# Patient Record
Sex: Female | Born: 1945 | Race: White | Hispanic: No | Marital: Married | Smoking: Never smoker
Health system: Southern US, Community
[De-identification: ages and names within clinical notes are randomized; demographics above are authoritative.]

## PROBLEM LIST (undated history)

## (undated) DIAGNOSIS — E782 Mixed hyperlipidemia: Secondary | ICD-10-CM

## (undated) DIAGNOSIS — R1084 Generalized abdominal pain: Secondary | ICD-10-CM

## (undated) DIAGNOSIS — I809 Phlebitis and thrombophlebitis of unspecified site: Secondary | ICD-10-CM

## (undated) DIAGNOSIS — I10 Essential (primary) hypertension: Secondary | ICD-10-CM

## (undated) DIAGNOSIS — E119 Type 2 diabetes mellitus without complications: Secondary | ICD-10-CM

## (undated) DIAGNOSIS — J3089 Other allergic rhinitis: Secondary | ICD-10-CM

## (undated) DIAGNOSIS — R5383 Other fatigue: Secondary | ICD-10-CM

## (undated) DIAGNOSIS — O99891 Other specified diseases and conditions complicating pregnancy: Secondary | ICD-10-CM

## (undated) DIAGNOSIS — O9989 Other specified diseases and conditions complicating pregnancy, childbirth and the puerperium: Secondary | ICD-10-CM

## (undated) DIAGNOSIS — Z96659 Presence of unspecified artificial knee joint: Secondary | ICD-10-CM

## (undated) DIAGNOSIS — R112 Nausea with vomiting, unspecified: Secondary | ICD-10-CM

## (undated) DIAGNOSIS — M150 Primary generalized (osteo)arthritis: Secondary | ICD-10-CM

## (undated) DIAGNOSIS — Z9289 Personal history of other medical treatment: Secondary | ICD-10-CM

## (undated) DIAGNOSIS — Z9889 Other specified postprocedural states: Secondary | ICD-10-CM

## (undated) DIAGNOSIS — N393 Stress incontinence (female) (male): Secondary | ICD-10-CM

## (undated) DIAGNOSIS — T8859XA Other complications of anesthesia, initial encounter: Secondary | ICD-10-CM

## (undated) DIAGNOSIS — K219 Gastro-esophageal reflux disease without esophagitis: Secondary | ICD-10-CM

## (undated) DIAGNOSIS — R1013 Epigastric pain: Secondary | ICD-10-CM

## (undated) DIAGNOSIS — T4145XA Adverse effect of unspecified anesthetic, initial encounter: Secondary | ICD-10-CM

## (undated) DIAGNOSIS — R531 Weakness: Secondary | ICD-10-CM

## (undated) DIAGNOSIS — K210 Gastro-esophageal reflux disease with esophagitis: Secondary | ICD-10-CM

## (undated) DIAGNOSIS — M1990 Unspecified osteoarthritis, unspecified site: Secondary | ICD-10-CM

## (undated) DIAGNOSIS — F419 Anxiety disorder, unspecified: Secondary | ICD-10-CM

## (undated) DIAGNOSIS — R5381 Other malaise: Secondary | ICD-10-CM

## (undated) HISTORY — DX: Epigastric pain: R10.13

## (undated) HISTORY — DX: Presence of unspecified artificial knee joint: Z96.659

## (undated) HISTORY — DX: Stress incontinence (female) (male): N39.3

## (undated) HISTORY — DX: Mixed hyperlipidemia: E78.2

## (undated) HISTORY — DX: Other allergic rhinitis: J30.89

## (undated) HISTORY — PX: HERNIA REPAIR: SHX51

## (undated) HISTORY — DX: Type 2 diabetes mellitus without complications: E11.9

## (undated) HISTORY — DX: Generalized abdominal pain: R10.84

## (undated) HISTORY — DX: Essential (primary) hypertension: I10

## (undated) HISTORY — PX: CATARACT EXTRACTION: SUR2

## (undated) HISTORY — DX: Weakness: R53.1

## (undated) HISTORY — PX: APPENDECTOMY: SHX54

## (undated) HISTORY — DX: Other malaise: R53.81

## (undated) HISTORY — PX: CHOLECYSTECTOMY: SHX55

## (undated) HISTORY — DX: Gastro-esophageal reflux disease with esophagitis: K21.0

## (undated) HISTORY — DX: Primary generalized (osteo)arthritis: M15.0

## (undated) HISTORY — DX: Other fatigue: R53.83

---

## 2001-03-31 HISTORY — PX: COLONOSCOPY: SHX174

## 2007-05-02 HISTORY — PX: JOINT REPLACEMENT: SHX530

## 2007-12-23 ENCOUNTER — Inpatient Hospital Stay (HOSPITAL_COMMUNITY): Admission: RE | Admit: 2007-12-23 | Discharge: 2007-12-25 | Payer: Self-pay | Admitting: Orthopedic Surgery

## 2010-09-13 NOTE — Op Note (Signed)
[name], Joanne Mitchell                ACCOUNT NO.:  [id number]   MEDICAL RECORD NO.:  [id number]          PATIENT TYPE:  INP   LOCATION:  5015                         FACILITY:  [facility]   PHYSICIAN:  Mila Homer. Sherlean Foot, M.D. DATE OF BIRTH:  1945-09-24   DATE OF PROCEDURE:  12/23/2007  DATE OF DISCHARGE:                               OPERATIVE REPORT   SURGEON:  Mila Homer. Sherlean Foot, MD   ASSISTANTS:  1. Skip Mayer, PA  2. Altamese Cabal, PA-C   ANESTHESIA:  General.   PREOPERATIVE DIAGNOSIS:  Left knee osteoarthritis.   POSTOPERATIVE DIAGNOSIS:  Left knee osteoarthritis.   PROCEDURE:  Left total knee arthroplasty.   INDICATIONS FOR PROCEDURE:  The patient is a 65 year old white female  with failure of conservative measures for osteoarthritis of the left  knee.  Informed consent was obtained.   DESCRIPTION OF PROCEDURE:  The patient was laid supine, administered  general anesthesia, and the Foley catheter was placed.  Left leg was  prepped and draped in the usual sterile fashion.  The extremity was  exsanguinated with the Esmarch and tourniquet inflated to 350 mmHg.  I  made a midline incision approximately 6 inches in length with a #10  blade.  I used a new blade to make a median parapatellar arthrotomy and  performed a synovectomy.  I elevated deep MCL off the medial crest of  the tibia.  I everted the patella, measured 22 mm thick.  I reamed down  9 mm and drilled 3 lug holes and with the prosthetic trial in place  recreated to 23-mm thickness.  I then removed the trial component and  went into flexion.  I used extramedullary alignment system on the tibia  and made a perpendicular cut to the anatomic axis.  I then used  intramedullary alignment system on the femur to make a 6-degree valgus  cut to distal varus knee.  I then marked out the epicondylar axis,  posterior condylar angle measured 3 degrees.  Sized to size D, pinned  through 3-degree external rotation holes.  I made the  anterior,  posterior, and chamfer cuts with a sagittal saw performed on a cutting  block.  I then removed the cutting block from the cut surfaces.  I then  placed a lamina spreader in the knee and removed the medial and lateral  menisci and posterior condylar osteophytes.  ACL and PCL were removed as  well.  I then placed a 12-mm spacer block in the knee.  I did have to do  a little bit more releasing medially.  I then put the scoop retractor in  place and finished the femur with a size D femoral block.  Finished the  tibia with a size 4 tibial tray drilling keel.  Then trialed with a 4  tibia, D femur, 12 insert, 29 patella, and had good flexion/extension  gap balance and good patellar tracking.  I removed the trial components.  I copiously irrigated.  I then cemented the components, allowed the  cement to harden in extension after removing excess cement.  I then  placed  a Hemovac deep to the arthrotomy coming out superolaterally.  I  brought the pain catheter from superomedially superficial to the  arthrotomy.  I then irrigated and closed with #1 Vicryl in the  arthrotomy, buried 0 Vicryl, subcuticular 2-0 Vicryl sutures, and skin  staples.   COMPLICATIONS:  None.   DRAINS:  One Hemovac and one pain catheter.   ESTIMATED BLOOD LOSS:  300 mL.           ______________________________  Mila Homer. Sherlean Foot, M.D.     SDL/MEDQ  D:  12/23/2007  T:  12/23/2007  Job:  [id number]

## 2010-09-16 NOTE — Discharge Summary (Signed)
Joanne Mitchell                ACCOUNT NO.:  [id number]   MEDICAL RECORD NO.:  [id number]          PATIENT TYPE:  INP   LOCATION:  5015                         FACILITY:  [facility]   PHYSICIAN:  Mila Homer. Sherlean Foot, M.D. DATE OF BIRTH:  [birth year]   DATE OF ADMISSION:  12/23/2007  DATE OF DISCHARGE:  12/25/2007                               DISCHARGE SUMMARY   ADMISSION DIAGNOSIS:  Osteoarthritis of the left knee, end-stage  arthritis.   DISCHARGE DIAGNOSES:  1. Osteoarthritis, left knee.  2. History of hypertension.  3. Obesity.  4. Diabetes type 2.  5. Esophageal reflux.  6. History of transient ischemic attack.  7. Acute blood loss anemia.   PROCEDURE:  Left total knee arthroscopy.   HISTORY:  Joanne Mitchell is a 65 year old female complaining of chronic left  knee pain with new-onset popping and catching for several months.  Pain  has limited her ADLs and she has failed conservative measures.  The  patient has very severe OA with loose body __________ x-ray and MRI  scan.  The risks and benefits of surgery were discussed with the  patient, and she would like to proceed with the procedure.   HOSPITAL PROCEDURE:  The patient is a 65 year old female who was  admitted on December 23, 2007, after appropriate laboratory studies were  obtained, as well as Ancef 2 g IV.  On call to the operating room, she  was taken to the operating room where she underwent a left total knee  arthroscopy.  She tolerated the procedure well, and she was placed on a  Dilaudid PCA upon reduced dose protocol and had a CPM and moved to 5000.  She was started the next day on Lovenox 30 mg subcutaneously q.12 h.  beginning December 24, 2007, at 8 a.m.  She also had consults to PT/OT.  Care managements were made in CPM.  She was weightbearing as tolerated.  CPM was 0 to 90 degrees for 6-8 hours per day.  Foley was placed  intraoperatively.  She was allowed out of bed to the chair on the  following day.  The patient  was up with physical therapy that evening.  On postoperative day 1, the patient was doing well.  Labs were stable.  She was weaned off her O2 and was out of bed with PT.  There were no  complications.  On next day on postoperative day 2, the patient  continued to work with PT.  Her PCA was pulled and her drains were  pulled out.  Her Foley was also discontinued.  On postoperative day 2,  she had marked improvement and was discharged home that day.   As for laboratory studies, she was admitted with hemoglobin of 15.5,  hematocrit of 35.5, and platelets of 199.  Her sodium was 136, potassium  4.5, chloride is 105, CO2 is 24, and glucose is 155.  Her BUN was 14 and  creatinine was 0.69.  Her white count was 9.5.  On discharge, her white  count was 11.2, her H&H was 12 and 35.5, and her platelets were 199.  Her sodium was 137, K was 3.3, chloride was 98, CO2 was 33, and glucose  was 162.  BUN was 3 and creatinine was 0.46.  There was no growth on  urine culture.   Her medications at the time of discharge were:  1. Zolpidem 10 mg as needed.  2. Hydrochlorothiazide 12.5 mg daily.  3. Nasonex spray 1 in each nostril as needed.  4. Amlodipine 5 mg daily.  5. Glimepiride 2 mg daily.  6. Multivitamin Silver 1 daily.  7. Prilosec 20 mg once daily.  8. She was placed on Robaxin 500 mg 1 tab every 6-8 hours as needed      for spasm.  9. Lovenox 40 mg inject once subcutaneously daily, last dose to be      01/06/2008.  10.Percocet 5/325, 1-2 tabs every 4-6 hours as needed for pain.  11.Potassium chloride 10 mEq 1 tab twice a day x1 week.   DISCHARGE INSTRUCTIONS:  The patient should follow her regular diabetic  diet.  She is to follow up with [facility] for wound care instructions  that she was given.  She is to increase activity slowly and she may use  a walker.  She is weightbearing as tolerated.  She will go home with  home health.  There is no lifting or driving x6 weeks.  She was given   prescriptions they were given above prescriptions for Robaxin, Percocet,  potassium chloride, and Lovenox.  She is to follow up with Dr. Sherlean Foot on  January 07, 2008, she is to call for that appointment at [phone].  The  patient was discharged in improved condition.     ______________________________  Altamese Cabal, PA-C    ______________________________  Mila Homer. Sherlean Foot, M.D.    MJ/MEDQ  D:  01/24/2008  T:  01/25/2008  Job:  [id number]

## 2014-02-26 DIAGNOSIS — M1711 Unilateral primary osteoarthritis, right knee: Secondary | ICD-10-CM

## 2014-02-26 HISTORY — DX: Unilateral primary osteoarthritis, right knee: M17.11

## 2014-07-17 ENCOUNTER — Other Ambulatory Visit: Payer: Self-pay | Admitting: Orthopedic Surgery

## 2014-07-30 NOTE — Pre-Procedure Instructions (Signed)
Bianney Tambi Thole  07/30/2014   Your procedure is scheduled on: Monday, April 11.  Report to [facility] Admitting at 5:30 AM.  Call this number if you have problems the morning of surgery: [phone]   Remember:   Do not eat food or drink liquids after midnight Sunday, April 10.   Take these medicines the morning of surgery with A SIP OF WATER: -   Do not wear jewelry, make-up or nail polish.  Do not wear lotions, powders, or perfumes.  Do not shave 48 hours prior to surgery.   Do not bring valuables to the hospital.              [facility] is not responsible for any belongings or valuables.               Contacts, dentures or bridgework may not be worn into surgery.  Leave suitcase in the car. After surgery it may be brought to your room.  For patients admitted to the hospital, discharge time is determined by your  treatment team.                Special Instructions: -    Please read over the following fact sheets that you were given: Pain Booklet, Coughing and Deep Breathing and Surgical Site Infection Prevention and Incentive Spirometry

## 2014-07-31 ENCOUNTER — Encounter (HOSPITAL_COMMUNITY)
Admission: RE | Admit: 2014-07-31 | Discharge: 2014-07-31 | Disposition: A | Discharge status: Home / Self Care | Payer: Medicare Other | Source: Ambulatory Visit | Attending: Orthopedic Surgery | Admitting: Orthopedic Surgery

## 2014-07-31 ENCOUNTER — Encounter (HOSPITAL_COMMUNITY): Payer: Self-pay

## 2014-07-31 DIAGNOSIS — Z01812 Encounter for preprocedural laboratory examination: Secondary | ICD-10-CM | POA: Insufficient documentation

## 2014-07-31 DIAGNOSIS — I517 Cardiomegaly: Secondary | ICD-10-CM | POA: Insufficient documentation

## 2014-07-31 DIAGNOSIS — Z01818 Encounter for other preprocedural examination: Secondary | ICD-10-CM | POA: Insufficient documentation

## 2014-07-31 HISTORY — DX: Essential (primary) hypertension: I10

## 2014-07-31 HISTORY — DX: Other specified postprocedural states: Z98.890

## 2014-07-31 HISTORY — DX: Anxiety disorder, unspecified: F41.9

## 2014-07-31 HISTORY — DX: Other complications of anesthesia, initial encounter: T88.59XA

## 2014-07-31 HISTORY — DX: Unspecified osteoarthritis, unspecified site: M19.90

## 2014-07-31 HISTORY — DX: Personal history of other medical treatment: Z92.89

## 2014-07-31 HISTORY — DX: Other specified diseases and conditions complicating pregnancy, childbirth and the puerperium: O99.89

## 2014-07-31 HISTORY — DX: Nausea with vomiting, unspecified: R11.2

## 2014-07-31 HISTORY — DX: Type 2 diabetes mellitus without complications: E11.9

## 2014-07-31 HISTORY — DX: Gastro-esophageal reflux disease without esophagitis: K21.9

## 2014-07-31 HISTORY — DX: Adverse effect of unspecified anesthetic, initial encounter: T41.45XA

## 2014-07-31 HISTORY — DX: Other specified diseases and conditions complicating pregnancy: O99.891

## 2014-07-31 HISTORY — DX: Phlebitis and thrombophlebitis of unspecified site: I80.9

## 2014-07-31 LAB — APTT: aPTT: 29 seconds (ref 24–37)

## 2014-07-31 LAB — URINALYSIS, ROUTINE W REFLEX MICROSCOPIC
Bilirubin Urine: NEGATIVE
Glucose, UA: NEGATIVE mg/dL
Ketones, ur: NEGATIVE mg/dL
Nitrite: NEGATIVE
PH: 6 (ref 5.0–8.0)
Protein, ur: NEGATIVE mg/dL
SPECIFIC GRAVITY, URINE: 1.019 (ref 1.005–1.030)
UROBILINOGEN UA: 0.2 mg/dL (ref 0.0–1.0)

## 2014-07-31 LAB — CBC WITH DIFFERENTIAL/PLATELET
BASOS ABS: 0.1 10*3/uL (ref 0.0–0.1)
Basophils Relative: 0 % (ref 0–1)
EOS PCT: 2 % (ref 0–5)
Eosinophils Absolute: 0.3 10*3/uL (ref 0.0–0.7)
HCT: 45.9 % (ref 36.0–46.0)
HEMOGLOBIN: 15.5 g/dL — AB (ref 12.0–15.0)
LYMPHS ABS: 4.2 10*3/uL — AB (ref 0.7–4.0)
LYMPHS PCT: 31 % (ref 12–46)
MCH: 30.9 pg (ref 26.0–34.0)
MCHC: 33.8 g/dL (ref 30.0–36.0)
MCV: 91.6 fL (ref 78.0–100.0)
Monocytes Absolute: 1.1 10*3/uL — ABNORMAL HIGH (ref 0.1–1.0)
Monocytes Relative: 8 % (ref 3–12)
NEUTROS ABS: 8.1 10*3/uL — AB (ref 1.7–7.7)
NEUTROS PCT: 59 % (ref 43–77)
PLATELETS: 297 10*3/uL (ref 150–400)
RBC: 5.01 MIL/uL (ref 3.87–5.11)
RDW: 14.5 % (ref 11.5–15.5)
WBC: 13.7 10*3/uL — AB (ref 4.0–10.5)

## 2014-07-31 LAB — URINE MICROSCOPIC-ADD ON

## 2014-07-31 LAB — COMPREHENSIVE METABOLIC PANEL
ALBUMIN: 4.1 g/dL (ref 3.5–5.2)
ALT: 18 U/L (ref 0–35)
AST: 16 U/L (ref 0–37)
Alkaline Phosphatase: 77 U/L (ref 39–117)
Anion gap: 10 (ref 5–15)
BUN: 18 mg/dL (ref 6–23)
CALCIUM: 9.7 mg/dL (ref 8.4–10.5)
CHLORIDE: 102 mmol/L (ref 96–112)
CO2: 27 mmol/L (ref 19–32)
CREATININE: 0.65 mg/dL (ref 0.50–1.10)
GFR calc Af Amer: >90 mL/min (ref >90)
GFR, EST NON AFRICAN AMERICAN: 89 mL/min — AB (ref >90)
Glucose, Bld: 108 mg/dL — ABNORMAL HIGH (ref 70–99)
Potassium: 4 mmol/L (ref 3.5–5.1)
SODIUM: 139 mmol/L (ref 135–145)
Total Bilirubin: 0.8 mg/dL (ref 0.3–1.2)
Total Protein: 7.5 g/dL (ref 6.0–8.3)

## 2014-07-31 LAB — PROTIME-INR
INR: 1.11 (ref 0.00–1.49)
Prothrombin Time: 14.5 seconds (ref 11.6–15.2)

## 2014-07-31 LAB — SURGICAL PCR SCREEN
MRSA, PCR: NEGATIVE
Staphylococcus aureus: POSITIVE — AB

## 2014-07-31 NOTE — Progress Notes (Signed)
Joanne Mitchell 07/30/2014  Your procedure is scheduled on: Monday, April 11. Report to [facility] Admitting at 5:30 AM. Call this number if you have problems the morning of surgery: [phone]  Remember:  Do not eat food or drink liquids after midnight Sunday, April 10. Take these medicines the morning of surgery with A SIP OF WATER: amlodipine, and use Omeprazole if needed    Do not wear jewelry, make-up or nail polish.  Do not wear lotions, powders, or perfumes.  Do not shave 48 hours prior to surgery.   Do not bring valuables to the hospital.    [facility] is not responsible for any belongings or valuables.   Contacts, dentures or bridgework may not be worn into surgery.  Leave suitcase in the car. After surgery it may be brought to your room.  For patients admitted to the hospital, discharge time is determined by your treatment team.    Special Instructions: Special Instructions: [facility] - Preparing for Surgery  Before surgery, you can play an important role.  Because skin is not sterile, your skin needs to be as free of germs as possible.  You can reduce the number of germs on you skin by washing with CHG (chlorahexidine gluconate) soap before surgery.  CHG is an antiseptic cleaner which kills germs and bonds with the skin to continue killing germs even after washing.  Please DO NOT use if you have an allergy to CHG or antibacterial soaps.  If your skin becomes reddened/irritated stop using the CHG and inform your nurse when you arrive at Short Stay.  Do not shave (including legs and underarms) for at least 48 hours prior to the first CHG shower.  You may shave your face.  Please follow these instructions  carefully:   1.  Shower with CHG Soap the night before surgery and the  morning of Surgery.  2.  If you choose to wash your hair, wash your hair first as usual with your  normal shampoo.  3.  After you shampoo, rinse your hair and body thoroughly to remove the  Shampoo.  4.  Use CHG as you would any other liquid soap.  You can apply chg directly to the skin and wash gently with scrungie or a clean washcloth.  5.  Apply the CHG Soap to your body ONLY FROM THE NECK DOWN.    Do not use on open wounds or open sores.  Avoid contact with your eyes, ears, mouth and genitals (private parts).  Wash genitals (private parts)   with your normal soap.  6.  Wash thoroughly, paying special attention to the area where your surgery will be performed.  7.  Thoroughly rinse your body with warm water from the neck down.  8.  DO NOT shower/wash with your normal soap after using and rinsing off   the CHG Soap.  9.  Pat yourself dry with a clean towel.            10.  Wear clean pajamas.            11 .  Place clean sheets on your bed the night of your first shower and do not sleep with pets.  Day of Surgery  Do not apply any lotions/deodorants the morning of surgery.  Please wear clean clothes to the hospital/surgery center.  Please read over the following fact sheets that you were given: Pain Booklet, Coughing and Deep Breathing and Surgical Site Infection Prevention and Incentive Spirometry

## 2014-07-31 NOTE — Progress Notes (Signed)
Requested last ov note, ekg from PCP office, Dr. Bea Graff.

## 2014-07-31 NOTE — Progress Notes (Signed)
Recently treated for poison ivy/oak, on prednisone & improving on face & L leg

## 2014-08-02 LAB — URINE CULTURE

## 2014-08-07 MED ORDER — TRANEXAMIC ACID 100 MG/ML IV SOLN
1000.0000 mg | INTRAVENOUS | Status: AC
  Administered 2014-08-10: 1000 mg via INTRAVENOUS
  Filled 2014-08-07: qty 10

## 2014-08-07 MED ORDER — BUPIVACAINE LIPOSOME 1.3 % IJ SUSP
20.0000 mL | Freq: Once | INTRAMUSCULAR | Status: AC
  Administered 2014-08-10: 20 mL
  Filled 2014-08-07: qty 20

## 2014-08-09 MED ORDER — CEFAZOLIN SODIUM-DEXTROSE 2-3 GM-% IV SOLR
2.0000 g | INTRAVENOUS | Status: AC
  Administered 2014-08-10: 2 g via INTRAVENOUS
  Filled 2014-08-09: qty 50

## 2014-08-10 ENCOUNTER — Inpatient Hospital Stay (HOSPITAL_COMMUNITY): Payer: Medicare Other | Admitting: Anesthesiology

## 2014-08-10 ENCOUNTER — Inpatient Hospital Stay (HOSPITAL_COMMUNITY)
Admission: RE | Admit: 2014-08-10 | Discharge: 2014-08-11 | DRG: 470 | Disposition: A | Discharge status: Home Health | Payer: Medicare Other | Source: Ambulatory Visit | Attending: Orthopedic Surgery | Admitting: Orthopedic Surgery

## 2014-08-10 ENCOUNTER — Encounter (HOSPITAL_COMMUNITY)
Admission: RE | Disposition: A | Discharge status: Home Health | Payer: Self-pay | Source: Ambulatory Visit | Attending: Orthopedic Surgery

## 2014-08-10 ENCOUNTER — Encounter (HOSPITAL_COMMUNITY): Payer: Self-pay | Admitting: *Deleted

## 2014-08-10 DIAGNOSIS — Z96659 Presence of unspecified artificial knee joint: Secondary | ICD-10-CM

## 2014-08-10 DIAGNOSIS — M1711 Unilateral primary osteoarthritis, right knee: Secondary | ICD-10-CM | POA: Diagnosis present

## 2014-08-10 DIAGNOSIS — Z9049 Acquired absence of other specified parts of digestive tract: Secondary | ICD-10-CM | POA: Diagnosis present

## 2014-08-10 DIAGNOSIS — Z881 Allergy status to other antibiotic agents status: Secondary | ICD-10-CM | POA: Diagnosis not present

## 2014-08-10 DIAGNOSIS — M25561 Pain in right knee: Secondary | ICD-10-CM | POA: Diagnosis present

## 2014-08-10 DIAGNOSIS — D62 Acute posthemorrhagic anemia: Secondary | ICD-10-CM | POA: Diagnosis not present

## 2014-08-10 DIAGNOSIS — E119 Type 2 diabetes mellitus without complications: Secondary | ICD-10-CM | POA: Diagnosis present

## 2014-08-10 DIAGNOSIS — Z8672 Personal history of thrombophlebitis: Secondary | ICD-10-CM | POA: Diagnosis not present

## 2014-08-10 DIAGNOSIS — K219 Gastro-esophageal reflux disease without esophagitis: Secondary | ICD-10-CM | POA: Diagnosis present

## 2014-08-10 DIAGNOSIS — I10 Essential (primary) hypertension: Secondary | ICD-10-CM | POA: Diagnosis present

## 2014-08-10 DIAGNOSIS — Z7952 Long term (current) use of systemic steroids: Secondary | ICD-10-CM

## 2014-08-10 HISTORY — PX: TOTAL KNEE ARTHROPLASTY: SHX125

## 2014-08-10 HISTORY — DX: Presence of unspecified artificial knee joint: Z96.659

## 2014-08-10 LAB — GLUCOSE, CAPILLARY
GLUCOSE-CAPILLARY: 222 mg/dL — AB (ref 70–99)
GLUCOSE-CAPILLARY: 243 mg/dL — AB (ref 70–99)
Glucose-Capillary: 140 mg/dL — ABNORMAL HIGH (ref 70–99)
Glucose-Capillary: 182 mg/dL — ABNORMAL HIGH (ref 70–99)
Glucose-Capillary: 242 mg/dL — ABNORMAL HIGH (ref 70–99)

## 2014-08-10 LAB — CBC
HEMATOCRIT: 42.8 % (ref 36.0–46.0)
HEMOGLOBIN: 13.8 g/dL (ref 12.0–15.0)
MCH: 30.5 pg (ref 26.0–34.0)
MCHC: 32.2 g/dL (ref 30.0–36.0)
MCV: 94.7 fL (ref 78.0–100.0)
Platelets: 262 10*3/uL (ref 150–400)
RBC: 4.52 MIL/uL (ref 3.87–5.11)
RDW: 14.8 % (ref 11.5–15.5)
WBC: 19 10*3/uL — ABNORMAL HIGH (ref 4.0–10.5)

## 2014-08-10 LAB — CREATININE, SERUM
CREATININE: 0.73 mg/dL (ref 0.50–1.10)
GFR calc non Af Amer: 86 mL/min — ABNORMAL LOW (ref >90)

## 2014-08-10 SURGERY — ARTHROPLASTY, KNEE, TOTAL
Anesthesia: General | Site: Knee | Laterality: Right

## 2014-08-10 MED ORDER — DIPHENHYDRAMINE HCL 50 MG/ML IJ SOLN
INTRAMUSCULAR | Status: DC | PRN
  Administered 2014-08-10: 12.5 mg via INTRAVENOUS

## 2014-08-10 MED ORDER — LORAZEPAM 0.5 MG PO TABS: 0.5000 mg | ORAL_TABLET | Freq: Two times a day (BID) | ORAL | Status: DC | PRN

## 2014-08-10 MED ORDER — LIDOCAINE HCL (CARDIAC) 20 MG/ML IV SOLN
INTRAVENOUS | Status: DC | PRN
  Administered 2014-08-10: 100 mg via INTRAVENOUS

## 2014-08-10 MED ORDER — PHENYLEPHRINE 40 MCG/ML (10ML) SYRINGE FOR IV PUSH (FOR BLOOD PRESSURE SUPPORT)
PREFILLED_SYRINGE | INTRAVENOUS | Status: AC
  Filled 2014-08-10: qty 10

## 2014-08-10 MED ORDER — METHOCARBAMOL 1000 MG/10ML IJ SOLN
500.0000 mg | INTRAVENOUS | Status: AC
  Administered 2014-08-10: 500 mg via INTRAVENOUS
  Filled 2014-08-10: qty 5

## 2014-08-10 MED ORDER — LIDOCAINE HCL (CARDIAC) 20 MG/ML IV SOLN
INTRAVENOUS | Status: AC
  Filled 2014-08-10: qty 5

## 2014-08-10 MED ORDER — OXYCODONE HCL 5 MG PO TABS
5.0000 mg | ORAL_TABLET | ORAL | Status: DC | PRN
  Administered 2014-08-10: 5 mg via ORAL
  Administered 2014-08-10 – 2014-08-11 (×6): 10 mg via ORAL
  Filled 2014-08-10: qty 2
  Filled 2014-08-10: qty 1
  Filled 2014-08-10 (×5): qty 2

## 2014-08-10 MED ORDER — LACTATED RINGERS IV SOLN
INTRAVENOUS | Status: DC | PRN
  Administered 2014-08-10 (×2): via INTRAVENOUS

## 2014-08-10 MED ORDER — METOCLOPRAMIDE HCL 5 MG/ML IJ SOLN
INTRAMUSCULAR | Status: DC | PRN
  Administered 2014-08-10: 10 mg via INTRAVENOUS

## 2014-08-10 MED ORDER — METHOCARBAMOL 1000 MG/10ML IJ SOLN
500.0000 mg | Freq: Four times a day (QID) | INTRAVENOUS | Status: DC | PRN
  Filled 2014-08-10: qty 5

## 2014-08-10 MED ORDER — FENTANYL CITRATE 0.05 MG/ML IJ SOLN
INTRAMUSCULAR | Status: DC | PRN
  Administered 2014-08-10 (×2): 100 ug via INTRAVENOUS
  Administered 2014-08-10: 50 ug via INTRAVENOUS

## 2014-08-10 MED ORDER — ENOXAPARIN SODIUM 30 MG/0.3ML ~~LOC~~ SOLN
30.0000 mg | Freq: Two times a day (BID) | SUBCUTANEOUS | Status: DC
Start: 2014-08-11 — End: 2014-08-11
  Administered 2014-08-11: 30 mg via SUBCUTANEOUS
  Filled 2014-08-10: qty 0.3

## 2014-08-10 MED ORDER — FLUTICASONE PROPIONATE 50 MCG/ACT NA SUSP
1.0000 | Freq: Every day | NASAL | Status: DC | PRN
Start: 2014-08-10 — End: 2014-08-11
  Filled 2014-08-10: qty 16

## 2014-08-10 MED ORDER — ONDANSETRON HCL 4 MG PO TABS: 4.0000 mg | ORAL_TABLET | Freq: Four times a day (QID) | ORAL | Status: DC | PRN

## 2014-08-10 MED ORDER — SENNOSIDES-DOCUSATE SODIUM 8.6-50 MG PO TABS: 1.0000 | ORAL_TABLET | Freq: Every evening | ORAL | Status: DC | PRN

## 2014-08-10 MED ORDER — BUPIVACAINE-EPINEPHRINE (PF) 0.5% -1:200000 IJ SOLN
30.0000 mL | INTRAMUSCULAR | Status: DC
  Filled 2014-08-10: qty 30

## 2014-08-10 MED ORDER — MIDAZOLAM HCL 5 MG/5ML IJ SOLN
INTRAMUSCULAR | Status: DC | PRN
  Administered 2014-08-10: 2 mg via INTRAVENOUS

## 2014-08-10 MED ORDER — METOCLOPRAMIDE HCL 5 MG/ML IJ SOLN
5.0000 mg | Freq: Three times a day (TID) | INTRAMUSCULAR | Status: DC | PRN
  Administered 2014-08-10: 10 mg via INTRAVENOUS
  Filled 2014-08-10: qty 2

## 2014-08-10 MED ORDER — AMLODIPINE BESYLATE 5 MG PO TABS
5.0000 mg | ORAL_TABLET | Freq: Every day | ORAL | Status: DC
  Administered 2014-08-11: 5 mg via ORAL
  Filled 2014-08-10: qty 1

## 2014-08-10 MED ORDER — CELECOXIB 200 MG PO CAPS
200.0000 mg | ORAL_CAPSULE | Freq: Two times a day (BID) | ORAL | Status: DC
Start: 2014-08-10 — End: 2014-08-11
  Administered 2014-08-10 – 2014-08-11 (×2): 200 mg via ORAL
  Filled 2014-08-10 (×2): qty 1

## 2014-08-10 MED ORDER — HYDROMORPHONE HCL 1 MG/ML IJ SOLN
1.0000 mg | INTRAMUSCULAR | Status: DC | PRN
  Administered 2014-08-10 (×3): 1 mg via INTRAVENOUS
  Filled 2014-08-10 (×3): qty 1

## 2014-08-10 MED ORDER — PHENOL 1.4 % MT LIQD: 1.0000 | OROMUCOSAL | Status: DC | PRN

## 2014-08-10 MED ORDER — PANTOPRAZOLE SODIUM 40 MG PO TBEC
40.0000 mg | DELAYED_RELEASE_TABLET | Freq: Every day | ORAL | Status: DC
  Administered 2014-08-11: 40 mg via ORAL
  Filled 2014-08-10: qty 1

## 2014-08-10 MED ORDER — HYDROMORPHONE HCL 1 MG/ML IJ SOLN
0.2500 mg | INTRAMUSCULAR | Status: DC | PRN
  Administered 2014-08-10 (×2): 0.25 mg via INTRAVENOUS
  Administered 2014-08-10 (×2): 0.5 mg via INTRAVENOUS

## 2014-08-10 MED ORDER — SODIUM CHLORIDE 0.9 % IJ SOLN
INTRAMUSCULAR | Status: DC | PRN
  Administered 2014-08-10: 30 mL via INTRAVENOUS

## 2014-08-10 MED ORDER — SCOPOLAMINE 1 MG/3DAYS TD PT72
MEDICATED_PATCH | TRANSDERMAL | Status: AC
  Filled 2014-08-10: qty 1

## 2014-08-10 MED ORDER — 0.9 % SODIUM CHLORIDE (POUR BTL) OPTIME
TOPICAL | Status: DC | PRN
  Administered 2014-08-10: 1000 mL

## 2014-08-10 MED ORDER — BISACODYL 5 MG PO TBEC: 5.0000 mg | DELAYED_RELEASE_TABLET | Freq: Every day | ORAL | Status: DC | PRN

## 2014-08-10 MED ORDER — DIPHENHYDRAMINE HCL 50 MG/ML IJ SOLN
INTRAMUSCULAR | Status: AC
  Filled 2014-08-10: qty 1

## 2014-08-10 MED ORDER — HYDROCHLOROTHIAZIDE 12.5 MG PO CAPS
12.5000 mg | ORAL_CAPSULE | Freq: Every day | ORAL | Status: DC
  Administered 2014-08-10 – 2014-08-11 (×2): 12.5 mg via ORAL
  Filled 2014-08-10 (×2): qty 1

## 2014-08-10 MED ORDER — GLIMEPIRIDE 4 MG PO TABS
2.0000 mg | ORAL_TABLET | Freq: Every day | ORAL | Status: DC
  Administered 2014-08-11: 2 mg via ORAL
  Filled 2014-08-10: qty 1

## 2014-08-10 MED ORDER — ROCURONIUM BROMIDE 100 MG/10ML IV SOLN
INTRAVENOUS | Status: DC | PRN
  Administered 2014-08-10: 30 mg via INTRAVENOUS

## 2014-08-10 MED ORDER — SODIUM CHLORIDE 0.9 % IV SOLN
INTRAVENOUS | Status: DC
  Administered 2014-08-10: 11:00:00 via INTRAVENOUS

## 2014-08-10 MED ORDER — OXYCODONE HCL ER 10 MG PO T12A
10.0000 mg | EXTENDED_RELEASE_TABLET | Freq: Two times a day (BID) | ORAL | Status: DC
  Administered 2014-08-10 – 2014-08-11 (×2): 10 mg via ORAL
  Filled 2014-08-10 (×2): qty 1

## 2014-08-10 MED ORDER — MENTHOL 3 MG MT LOZG: 1.0000 | LOZENGE | OROMUCOSAL | Status: DC | PRN

## 2014-08-10 MED ORDER — MIDAZOLAM HCL 2 MG/2ML IJ SOLN
INTRAMUSCULAR | Status: AC
  Filled 2014-08-10: qty 2

## 2014-08-10 MED ORDER — FLEET ENEMA 7-19 GM/118ML RE ENEM: 1.0000 | ENEMA | Freq: Once | RECTAL | Status: AC | PRN

## 2014-08-10 MED ORDER — NEOSTIGMINE METHYLSULFATE 10 MG/10ML IV SOLN
INTRAVENOUS | Status: DC | PRN
  Administered 2014-08-10: 3 mg via INTRAVENOUS

## 2014-08-10 MED ORDER — TRANEXAMIC ACID 100 MG/ML IV SOLN
1000.0000 mg | Freq: Once | INTRAVENOUS | Status: DC
  Filled 2014-08-10: qty 10

## 2014-08-10 MED ORDER — ROCURONIUM BROMIDE 50 MG/5ML IV SOLN
INTRAVENOUS | Status: AC
  Filled 2014-08-10: qty 1

## 2014-08-10 MED ORDER — CHLORHEXIDINE GLUCONATE 4 % EX LIQD
60.0000 mL | Freq: Once | CUTANEOUS | Status: DC
  Filled 2014-08-10: qty 60

## 2014-08-10 MED ORDER — METOCLOPRAMIDE HCL 5 MG/ML IJ SOLN
INTRAMUSCULAR | Status: AC
  Filled 2014-08-10: qty 2

## 2014-08-10 MED ORDER — HYDROMORPHONE HCL 1 MG/ML IJ SOLN
INTRAMUSCULAR | Status: AC
Start: 2014-08-10 — End: 2014-08-10
  Filled 2014-08-10: qty 1

## 2014-08-10 MED ORDER — DEXAMETHASONE SODIUM PHOSPHATE 4 MG/ML IJ SOLN
INTRAMUSCULAR | Status: DC | PRN
  Administered 2014-08-10: 4 mg via INTRAVENOUS

## 2014-08-10 MED ORDER — ZOLPIDEM TARTRATE 5 MG PO TABS: 5.0000 mg | ORAL_TABLET | Freq: Every evening | ORAL | Status: DC | PRN

## 2014-08-10 MED ORDER — SCOPOLAMINE 1 MG/3DAYS TD PT72
MEDICATED_PATCH | TRANSDERMAL | Status: DC | PRN
  Administered 2014-08-10: 1 via TRANSDERMAL

## 2014-08-10 MED ORDER — METOCLOPRAMIDE HCL 5 MG PO TABS: 5.0000 mg | ORAL_TABLET | Freq: Three times a day (TID) | ORAL | Status: DC | PRN

## 2014-08-10 MED ORDER — CHLORHEXIDINE GLUCONATE 4 % EX LIQD
60.0000 mL | Freq: Once | CUTANEOUS | Status: DC
Start: 2014-08-10 — End: 2014-08-10
  Filled 2014-08-10: qty 60

## 2014-08-10 MED ORDER — ACETAMINOPHEN 325 MG PO TABS: 650.0000 mg | ORAL_TABLET | Freq: Four times a day (QID) | ORAL | Status: DC | PRN

## 2014-08-10 MED ORDER — DIPHENHYDRAMINE HCL 12.5 MG/5ML PO ELIX: 12.5000 mg | ORAL_SOLUTION | ORAL | Status: DC | PRN

## 2014-08-10 MED ORDER — ALUM & MAG HYDROXIDE-SIMETH 200-200-20 MG/5ML PO SUSP: 30.0000 mL | ORAL | Status: DC | PRN

## 2014-08-10 MED ORDER — SUCCINYLCHOLINE CHLORIDE 20 MG/ML IJ SOLN
INTRAMUSCULAR | Status: AC
  Filled 2014-08-10: qty 1

## 2014-08-10 MED ORDER — CEFAZOLIN SODIUM-DEXTROSE 2-3 GM-% IV SOLR
2.0000 g | Freq: Four times a day (QID) | INTRAVENOUS | Status: AC
  Administered 2014-08-10: 2 g via INTRAVENOUS
  Filled 2014-08-10 (×2): qty 50

## 2014-08-10 MED ORDER — PROPOFOL 10 MG/ML IV BOLUS
INTRAVENOUS | Status: DC | PRN
  Administered 2014-08-10: 200 mg via INTRAVENOUS

## 2014-08-10 MED ORDER — SODIUM CHLORIDE 0.9 % IV SOLN: INTRAVENOUS | Status: DC

## 2014-08-10 MED ORDER — INSULIN ASPART 100 UNIT/ML ~~LOC~~ SOLN
0.0000 [IU] | Freq: Three times a day (TID) | SUBCUTANEOUS | Status: DC
  Administered 2014-08-10 (×2): 5 [IU] via SUBCUTANEOUS
  Administered 2014-08-11: 3 [IU] via SUBCUTANEOUS

## 2014-08-10 MED ORDER — PROPOFOL 10 MG/ML IV BOLUS
INTRAVENOUS | Status: AC
  Filled 2014-08-10: qty 20

## 2014-08-10 MED ORDER — METHOCARBAMOL 500 MG PO TABS
500.0000 mg | ORAL_TABLET | Freq: Four times a day (QID) | ORAL | Status: DC | PRN
  Administered 2014-08-10 – 2014-08-11 (×2): 500 mg via ORAL
  Filled 2014-08-10 (×2): qty 1

## 2014-08-10 MED ORDER — DOCUSATE SODIUM 100 MG PO CAPS
100.0000 mg | ORAL_CAPSULE | Freq: Two times a day (BID) | ORAL | Status: DC
  Administered 2014-08-10 – 2014-08-11 (×2): 100 mg via ORAL
  Filled 2014-08-10 (×2): qty 1

## 2014-08-10 MED ORDER — SODIUM CHLORIDE 0.9 % IR SOLN
Status: DC | PRN
  Administered 2014-08-10: 1000 mL

## 2014-08-10 MED ORDER — ONDANSETRON HCL 4 MG/2ML IJ SOLN: 4.0000 mg | Freq: Once | INTRAMUSCULAR | Status: DC | PRN

## 2014-08-10 MED ORDER — GLYCOPYRROLATE 0.2 MG/ML IJ SOLN
INTRAMUSCULAR | Status: DC | PRN
  Administered 2014-08-10: 0.4 mg via INTRAVENOUS

## 2014-08-10 MED ORDER — PHENYLEPHRINE HCL 10 MG/ML IJ SOLN
INTRAMUSCULAR | Status: DC | PRN
  Administered 2014-08-10 (×3): 80 ug via INTRAVENOUS

## 2014-08-10 MED ORDER — ONDANSETRON HCL 4 MG/2ML IJ SOLN
INTRAMUSCULAR | Status: DC | PRN
  Administered 2014-08-10: 4 mg via INTRAVENOUS

## 2014-08-10 MED ORDER — FENTANYL CITRATE 0.05 MG/ML IJ SOLN
INTRAMUSCULAR | Status: AC
  Filled 2014-08-10: qty 5

## 2014-08-10 MED ORDER — OXYCODONE HCL 5 MG PO TABS
ORAL_TABLET | ORAL | Status: AC
  Filled 2014-08-10: qty 2

## 2014-08-10 MED ORDER — SUCCINYLCHOLINE CHLORIDE 20 MG/ML IJ SOLN
INTRAMUSCULAR | Status: DC | PRN
  Administered 2014-08-10: 120 mg via INTRAVENOUS

## 2014-08-10 MED ORDER — ONDANSETRON HCL 4 MG/2ML IJ SOLN
4.0000 mg | Freq: Four times a day (QID) | INTRAMUSCULAR | Status: DC | PRN
Start: 2014-08-10 — End: 2014-08-11

## 2014-08-10 MED ORDER — ACETAMINOPHEN 650 MG RE SUPP: 650.0000 mg | Freq: Four times a day (QID) | RECTAL | Status: DC | PRN

## 2014-08-10 MED ORDER — ONDANSETRON HCL 4 MG/2ML IJ SOLN
INTRAMUSCULAR | Status: AC
  Filled 2014-08-10: qty 2

## 2014-08-10 MED ORDER — DEXAMETHASONE SODIUM PHOSPHATE 4 MG/ML IJ SOLN
INTRAMUSCULAR | Status: AC
  Filled 2014-08-10: qty 1

## 2014-08-10 SURGICAL SUPPLY — 60 items
BANDAGE ELASTIC 6 VELCRO ST LF (GAUZE/BANDAGES/DRESSINGS) ×3 IMPLANT
BANDAGE ESMARK 6X9 LF (GAUZE/BANDAGES/DRESSINGS) ×1 IMPLANT
BLADE SAGITTAL 13X1.27X60 (BLADE) ×2 IMPLANT
BLADE SAGITTAL 13X1.27X60MM (BLADE) ×1
BLADE SAW SGTL 83.5X18.5 (BLADE) ×3 IMPLANT
BLADE SURG 10 STRL SS (BLADE) ×3 IMPLANT
BNDG ESMARK 6X9 LF (GAUZE/BANDAGES/DRESSINGS) ×3
BOWL SMART MIX CTS (DISPOSABLE) ×3 IMPLANT
CAPT KNEE TOTAL 3 ×3 IMPLANT
CEMENT BONE SIMPLEX SPEEDSET (Cement) ×6 IMPLANT
COVER SURGICAL LIGHT HANDLE (MISCELLANEOUS) ×3 IMPLANT
CUFF TOURNIQUET SINGLE 34IN LL (TOURNIQUET CUFF) ×3 IMPLANT
DRAIN CHANNEL 10F 3/8 F FF (DRAIN) ×3 IMPLANT
DRAPE EXTREMITY T 121X128X90 (DRAPE) ×3 IMPLANT
DRAPE IMP U-DRAPE 54X76 (DRAPES) ×3 IMPLANT
DRAPE INCISE IOBAN 66X45 STRL (DRAPES) ×6 IMPLANT
DRAPE PROXIMA HALF (DRAPES) IMPLANT
DRAPE U-SHAPE 47X51 STRL (DRAPES) ×3 IMPLANT
DRSG ADAPTIC 3X8 NADH LF (GAUZE/BANDAGES/DRESSINGS) ×3 IMPLANT
DRSG PAD ABDOMINAL 8X10 ST (GAUZE/BANDAGES/DRESSINGS) ×3 IMPLANT
DURAPREP 26ML APPLICATOR (WOUND CARE) ×6 IMPLANT
ELECT REM PT RETURN 9FT ADLT (ELECTROSURGICAL) ×3
ELECTRODE REM PT RTRN 9FT ADLT (ELECTROSURGICAL) ×1 IMPLANT
GAUZE SPONGE 4X4 12PLY STRL (GAUZE/BANDAGES/DRESSINGS) ×3 IMPLANT
GLOVE BIOGEL M 7.0 STRL (GLOVE) IMPLANT
GLOVE BIOGEL PI IND STRL 7.5 (GLOVE) ×1 IMPLANT
GLOVE BIOGEL PI IND STRL 8.5 (GLOVE) ×2 IMPLANT
GLOVE BIOGEL PI INDICATOR 7.5 (GLOVE) ×2
GLOVE BIOGEL PI INDICATOR 8.5 (GLOVE) ×4
GLOVE SURG ORTHO 8.0 STRL STRW (GLOVE) ×6 IMPLANT
GOWN STRL REUS W/ TWL LRG LVL3 (GOWN DISPOSABLE) ×1 IMPLANT
GOWN STRL REUS W/ TWL XL LVL3 (GOWN DISPOSABLE) ×2 IMPLANT
GOWN STRL REUS W/TWL LRG LVL3 (GOWN DISPOSABLE) ×2
GOWN STRL REUS W/TWL XL LVL3 (GOWN DISPOSABLE) ×4
HANDPIECE INTERPULSE COAX TIP (DISPOSABLE) ×2
HOOD PEEL AWAY FACE SHEILD DIS (HOOD) ×9 IMPLANT
KIT BASIN OR (CUSTOM PROCEDURE TRAY) ×3 IMPLANT
KIT ROOM TURNOVER OR (KITS) ×3 IMPLANT
KNEE CAPITATED TOTAL 3 ×1 IMPLANT
MANIFOLD NEPTUNE II (INSTRUMENTS) ×3 IMPLANT
NEEDLE 22X1 1/2 (OR ONLY) (NEEDLE) ×6 IMPLANT
NS IRRIG 1000ML POUR BTL (IV SOLUTION) ×3 IMPLANT
PACK TOTAL JOINT (CUSTOM PROCEDURE TRAY) ×3 IMPLANT
PACK UNIVERSAL I (CUSTOM PROCEDURE TRAY) ×3 IMPLANT
PAD ARMBOARD 7.5X6 YLW CONV (MISCELLANEOUS) ×6 IMPLANT
PADDING CAST COTTON 6X4 STRL (CAST SUPPLIES) ×3 IMPLANT
SET HNDPC FAN SPRY TIP SCT (DISPOSABLE) ×1 IMPLANT
SPONGE GAUZE 4X4 12PLY STER LF (GAUZE/BANDAGES/DRESSINGS) ×3 IMPLANT
STAPLER VISISTAT 35W (STAPLE) ×3 IMPLANT
SUCTION FRAZIER TIP 10 FR DISP (SUCTIONS) ×3 IMPLANT
SUT BONE WAX W31G (SUTURE) ×3 IMPLANT
SUT VIC AB 0 CTB1 27 (SUTURE) ×6 IMPLANT
SUT VIC AB 1 CT1 27 (SUTURE) ×6
SUT VIC AB 1 CT1 27XBRD ANBCTR (SUTURE) ×3 IMPLANT
SUT VIC AB 2-0 CT1 27 (SUTURE) ×4
SUT VIC AB 2-0 CT1 TAPERPNT 27 (SUTURE) ×2 IMPLANT
SYR 20CC LL (SYRINGE) ×6 IMPLANT
TOWEL OR 17X24 6PK STRL BLUE (TOWEL DISPOSABLE) ×3 IMPLANT
TOWEL OR 17X26 10 PK STRL BLUE (TOWEL DISPOSABLE) ×3 IMPLANT
WATER STERILE IRR 1000ML POUR (IV SOLUTION) ×6 IMPLANT

## 2014-08-10 NOTE — Op Note (Signed)
TOTAL KNEE REPLACEMENT OPERATIVE NOTE:  08/10/2014  1:33 PM  PATIENT:  Joanne Mitchell  69 y.o. female  PRE-OPERATIVE DIAGNOSIS:  primary osteoarthritis right knee  POST-OPERATIVE DIAGNOSIS:  primary osteoarthritis right knee  PROCEDURE:  Procedure(s): TOTAL KNEE ARTHROPLASTY  SURGEON:  Surgeon(s): Vickey Huger, MD  PHYSICIAN ASSISTANT: Carlynn Spry, [facility]  ANESTHESIA:   general  DRAINS: Hemovac  SPECIMEN: None  COUNTS:  Correct  TOURNIQUET:   Total Tourniquet Time Documented: Thigh (Right) - 64 minutes Total: Thigh (Right) - 64 minutes   DICTATION:  Indication for procedure:    The patient is a 69 y.o. female who has failed conservative treatment for primary osteoarthritis right knee.  Informed consent was obtained prior to anesthesia. The risks versus benefits of the operation were explain and in a way the patient can, and did, understand.   On the implant demand matching protocol, this patient scored 8.  Therefore, this patient did" "did not receive a polyethylene insert with vitamin E which is a high demand implant.  Description of procedure:     The patient was taken to the operating room and placed under anesthesia.  The patient was positioned in the usual fashion taking care that all body parts were adequately padded and/or protected.  I foley catheter was not placed.  A tourniquet was applied and the leg prepped and draped in the usual sterile fashion.  The extremity was exsanguinated with the esmarch and tourniquet inflated to 350 mmHg.  Pre-operative range of motion was 5-90 degrees flexion.  The knee was in 8 degree of significant varus.  A midline incision approximately 6-7 inches long was made with a #10 blade.  A new blade was used to make a parapatellar arthrotomy going 2-3 cm into the quadriceps tendon, over the patella, and alongside the medial aspect of the patellar tendon.  A synovectomy was then performed with the #10 blade and forceps. I then elevated  the deep MCL off the medial tibial metaphysis subperiosteally around to the semimembranosus attachment.    I everted the patella and used calipers to measure patellar thickness.  I used the reamer to ream down to appropriate thickness to recreate the native thickness.  I then removed excess bone with the rongeur and sagittal saw.  I used the appropriately sized template and drilled the three lug holes.  I then put the trial in place and measured the thickness with the calipers to ensure recreation of the native thickness.  The trial was then removed and the patella subluxed and the knee brought into flexion.  A homan retractor was place to retract and protect the patella and lateral structures.  A Z-retractor was place medially to protect the medial structures.  The extra-medullary alignment system was used to make cut the tibial articular surface perpendicular to the anamotic axis of the tibia and in 3 degrees of posterior slope.  The cut surface and alignment jig was removed.  I then used the intramedullary alignment guide to make a 6 valgus cut on the distal femur.  I then marked out the epicondylar axis on the distal femur.  The posterior condylar axis measured 3 degrees.  I then used the anterior referencing sizer and measured the femur to be a size 8.  The 4-In-1 cutting block was screwed into place in external rotation matching the posterior condylar angle, making our cuts perpendicular to the epicondylar axis.  Anterior, posterior and chamfer cuts were made with the sagittal saw.  The cutting block and  cut pieces were removed.  A lamina spreader was placed in 90 degrees of flexion.  The ACL, PCL, menisci, and posterior condylar osteophytes were removed.  A 16 mm spacer blocked was found to offer good flexion and extension gap balance after moderate in degree releasing.   The scoop retractor was then placed and the femoral finishing block was pinned in place.  The small sagittal saw was used as well  as the lug drill to finish the femur.  The block and cut surfaces were removed and the medullary canal hole filled with autograft bone from the cut pieces.  The tibia was delivered forward in deep flexion and external rotation.  A size E tray was selected and pinned into place centered on the medial 1/3 of the tibial tubercle.  The reamer and keel was used to prepare the tibia through the tray.    I then trialed with the size 8 femur, size E tibia, a 16 mm insert and the 32 patella.  I had excellent flexion/extension gap balance, excellent patella tracking.  Flexion was full and beyond 120 degrees; extension was zero.  These components were chosen and the staff opened them to me on the back table while the knee was lavaged copiously and the cement mixed.  The soft tissue was infiltrated with 60cc of exparel 1.3% through a 21 gauge needle.  I cemented in the components and removed all excess cement.  The polyethylene tibial component was snapped into place and the knee placed in extension while cement was hardening.  The capsule was infilltrated with 30cc of .25% Marcaine with epinephrine.  A hemovac was place in the joint exiting superolaterally.  A pain pump was place superomedially superficial to the arthrotomy.  Once the cement was hard, the tourniquet was let down.  Hemostasis was obtained.  The arthrotomy was closed with figure-8 #1 vicryl sutures.  The deep soft tissues were closed with #0 vicryls and the subcuticular layer closed with a running #2-0 vicryl.  The skin was reapproximated and closed with skin staples.  The wound was dressed with xeroform, 4 x4's, 2 ABD sponges, a single layer of webril and a TED stocking.   The patient was then awakened, extubated, and taken to the recovery room in stable condition.  BLOOD LOSS:  300cc DRAINS: 1 hemovac, 1 pain catheter COMPLICATIONS:  None.  PLAN OF CARE: Admit to inpatient   PATIENT DISPOSITION:  PACU - hemodynamically stable.   Delay start  of Pharmacological VTE agent (>24hrs) due to surgical blood loss or risk of bleeding:  not applicable  Please fax a copy of this op note to my office at [phone] (please only include page 1 and 2 of the Case Information op note)

## 2014-08-10 NOTE — Progress Notes (Signed)
Orthopedic Tech Progress Note Patient Details:  Joanne Mitchell 27-Mar-1946 [id number] On cpm at 7:20 pm Patient ID: Joanne Mitchell, female   DOB: 1945/11/09, 69 y.o.   MRN: [id number]   Braulio Bosch 08/10/2014, 7:20 PM

## 2014-08-10 NOTE — Progress Notes (Signed)
Orthopedic Tech Progress Note Patient Details:  Joanne Mitchell 08-28-1945 [id number]  CPM Right Knee CPM Right Knee: On Right Knee Flexion (Degrees): 90 Right Knee Extension (Degrees): 0 Additional Comments: trapeze bar patient helper Viewed order from doctor's order list  Hildred Priest 08/10/2014, 10:23 AM

## 2014-08-10 NOTE — Anesthesia Postprocedure Evaluation (Signed)
  Anesthesia Post-op Note  Patient: Joanne Mitchell  Procedure(s) Performed: Procedure(s): TOTAL KNEE ARTHROPLASTY (Right)  Patient Location: PACU  Anesthesia Type: General   Level of Consciousness: awake, alert  and oriented  Airway and Oxygen Therapy: Patient Spontanous Breathing  Post-op Pain: mild  Post-op Assessment: Post-op Vital signs reviewed  Post-op Vital Signs: Reviewed  Last Vitals:  Filed Vitals:   08/10/14 1042  BP:   Pulse:   Temp: 36.7 C  Resp:     Complications: No apparent anesthesia complications

## 2014-08-10 NOTE — Anesthesia Preprocedure Evaluation (Addendum)
Anesthesia Evaluation  Patient identified by MRN, date of birth, ID band Patient awake    Reviewed: Allergy & Precautions, NPO status , Patient's Chart, lab work & pertinent test results  History of Anesthesia Complications (+) PONV  Airway Mallampati: I       Dental   Pulmonary  breath sounds clear to auscultation  Pulmonary exam normal       Cardiovascular hypertension, Rhythm:Regular Rate:Normal     Neuro/Psych    GI/Hepatic GERD-  ,  Endo/Other  diabetes, Type 2, Oral Hypoglycemic Agents  Renal/GU      Musculoskeletal  (+) Arthritis -,   Abdominal   Peds  Hematology   Anesthesia Other Findings   Reproductive/Obstetrics                            Anesthesia Physical Anesthesia Plan  ASA: II  Anesthesia Plan: General   Post-op Pain Management:    Induction:   Airway Management Planned: LMA and Oral ETT  Additional Equipment:   Intra-op Plan:   Post-operative Plan: Extubation in OR  Informed Consent: I have reviewed the patients History and Physical, chart, labs and discussed the procedure including the risks, benefits and alternatives for the proposed anesthesia with the patient or authorized representative who has indicated his/her understanding and acceptance.     Plan Discussed with: CRNA, Anesthesiologist and Surgeon  Anesthesia Plan Comments:         Anesthesia Quick Evaluation

## 2014-08-10 NOTE — Anesthesia Procedure Notes (Signed)
Anesthesia Regional Block:  Adductor canal block  Pre-Anesthetic Checklist: ,, timeout performed, Correct Patient, Correct Site, Correct Laterality, Correct Procedure, Correct Position, site marked, Risks and benefits discussed,  Surgical consent,  Pre-op evaluation,  At surgeon's request and post-op pain management  Laterality: Right  Prep: Maximum Sterile Barrier Precautions used, chloraprep and alcohol swabs       Needles:  Injection technique: Single-shot  Needle Type: Stimulator Needle - 80          Additional Needles:  Procedures: Doppler guided Adductor canal block Narrative:  Start time: 08/10/2014 7:00 AM End time: 08/10/2014 7:10 AM Injection made incrementally with aspirations every 5 mL.  Performed by: Personally   Additional Notes: Pt accepts procedure w/ risks. 20cc 0.5% Marcaine w/ epi w/o difficulty or discomfort. GES

## 2014-08-10 NOTE — H&P (Signed)
Joanne Mitchell MRN:  [id number] DOB/SEX:  March 17, 1946/female  CHIEF COMPLAINT:  Painful right Knee  HISTORY: Patient is a 69 y.o. female presented with a history of pain in the right knee. Onset of symptoms was gradual starting several years ago with gradually worsening course since that time. Prior procedures on the knee include arthroscopy. Patient has been treated conservatively with over-the-counter NSAIDs and activity modification. Patient currently rates pain in the knee at 10 out of 10 with activity. There is pain at night.  PAST MEDICAL HISTORY: There are no active problems to display for this patient.  Past Medical History  Diagnosis Date  . Complication of anesthesia     novacaine /w epinephrine caused her heart to race, she remarks about post op, she had N&V  . PONV (postoperative nausea and vomiting)   . Hypertension   . 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 Bettina Gavia   . Phlebitis     during pregnancy - 45 yrs. ago  . Diabetes mellitus without complication   . Anxiety     uses Ativan 4-5x's per month  . GERD (gastroesophageal reflux disease)   . Arthritis     knee- R  . Maternal blood transfusion     45 yrs. ago    Past Surgical History  Procedure Laterality Date  . Hernia repair      umbilical   . Joint replacement Left 2009  . Appendectomy    . Cholecystectomy       MEDICATIONS:   Prescriptions prior to admission  Medication Sig Dispense Refill Last Dose  . amLODipine (NORVASC) 5 MG tablet Take 5 mg by mouth daily before breakfast.    08/10/2014 at 0430  . aspirin 81 MG tablet Take 81 mg by mouth daily.   Past Week at Unknown time  . fluticasone (FLONASE) 50 MCG/ACT nasal spray Place 1 spray into both nostrils daily as needed for allergies or rhinitis.   Past Month at Unknown time  . glimepiride (AMARYL) 2 MG tablet Take 2 mg by mouth daily with breakfast.   08/09/2014 at  Unknown time  . hydrochlorothiazide (MICROZIDE) 12.5 MG capsule Take 12.5 mg by mouth daily.   08/09/2014 at Unknown time  . LORazepam (ATIVAN) 0.5 MG tablet Take 0.5 mg by mouth 2 (two) times daily as needed for anxiety.   Past Week at Unknown time  . Multiple Vitamins-Minerals (MULTIVITAMIN WITH MINERALS) tablet Take 1 tablet by mouth daily.   Past Week at Unknown time  . omeprazole (PRILOSEC) 20 MG capsule Take 20 mg by mouth daily as needed (acid reflux).   08/09/2014 at Unknown time  . predniSONE (DELTASONE) 10 MG tablet Take 10 mg by mouth See admin instructions. Tapered Dose   Past Week at Unknown time    ALLERGIES:   Allergies  Allergen Reactions  . Novocain [Procaine]     With Epinephrine- causes tachycardia    REVIEW OF SYSTEMS:  A comprehensive review of systems was negative.   FAMILY HISTORY:  History reviewed. No pertinent family history.  SOCIAL HISTORY:   History  Substance Use Topics  . Smoking status: Never Smoker   . Smokeless tobacco: Not on file  . Alcohol Use: Yes     Comment: "glass of wine once & a while"     EXAMINATION:  Vital signs in last 24 hours: Temp:  [98.1 F (  36.7 C)] 98.1 F (36.7 C) (04/11 0605) Pulse Rate:  [70] 70 (04/11 0605) Resp:  [16] 16 (04/11 0605) BP: (134)/(68) 134/68 mmHg (04/11 0605) SpO2:  [97 %] 97 % (04/11 0605)  General appearance: alert, cooperative and no distress Lungs: clear to auscultation bilaterally Heart: regular rate and rhythm, S1, S2 normal, no murmur, click, rub or gallop Abdomen: soft, non-tender; bowel sounds normal; no masses,  no organomegaly Extremities: extremities normal, atraumatic, no cyanosis or edema and Homans sign is negative, no sign of DVT Pulses: 2+ and symmetric Skin: Skin color, texture, turgor normal. No rashes or lesions Neurologic: Alert and oriented X 3, normal strength and tone. Normal symmetric reflexes. Normal coordination and gait  Musculoskeletal:  ROM 0-100, Ligaments intact,   Imaging Review Plain radiographs demonstrate severe degenerative joint disease of the right knee. The overall alignment is significant varus. The bone quality appears to be good for age and reported activity level.  Assessment/Plan: Primary osteoarthritis, right knee   The patient history, physical examination and imaging studies are consistent with advanced degenerative joint disease of the right knee. The patient has failed conservative treatment.  The clearance notes were reviewed.  After discussion with the patient it was felt that Total Knee Replacement was indicated. The procedure,  risks, and benefits of total knee arthroplasty were presented and reviewed. The risks including but not limited to aseptic loosening, infection, blood clots, vascular injury, stiffness, patella tracking problems complications among others were discussed. The patient acknowledged the explanation, agreed to proceed with the plan.  Vickii Volland 08/10/2014, 6:42 AM

## 2014-08-10 NOTE — Discharge Instructions (Signed)

## 2014-08-10 NOTE — Progress Notes (Signed)
Utilization review completed.  

## 2014-08-10 NOTE — Transfer of Care (Signed)
Immediate Anesthesia Transfer of Care Note  Patient: Joanne Mitchell  Procedure(s) Performed: Procedure(s): TOTAL KNEE ARTHROPLASTY (Right)  Patient Location: PACU  Anesthesia Type:General  Level of Consciousness: awake  Airway & Oxygen Therapy: Patient Spontanous Breathing and Patient connected to nasal cannula oxygen  Post-op Assessment: Report given to RN and Post -op Vital signs reviewed and stable  Post vital signs: Reviewed and stable  Last Vitals:  Filed Vitals:   08/10/14 0930  BP: 152/71  Pulse: 80  Temp: 36.4 C  Resp: 16    Complications: No apparent anesthesia complications

## 2014-08-10 NOTE — Progress Notes (Signed)
Patient's Hemovac has been accidentally pulled out. Orthopedic office contacted. On call personnel should be contacting night nurse Garen Grams) to advise.

## 2014-08-10 NOTE — Evaluation (Signed)
Physical Therapy Evaluation Patient Details Name: Joanne Mitchell MRN: [id number] DOB: 12-06-1945 Today's Date: 08/10/2014   History of Present Illness  Pt is a 69 y/o F s/p R TKA.  Pt's PMH includes PONV, HTN, GERD, DM.  Clinical Impression  Pt is s/p R TKA resulting in the deficits listed below (see PT Problem List). Pt will benefit from skilled PT to increase their independence and safety with mobility to allow discharge to the venue listed below.  Pt is anticipating d/c to home tomorrow and stairs will be assessed at next visit if appropriate.    Follow Up Recommendations Outpatient PT;Supervision/Assistance - 24 hour    Equipment Recommendations  None recommended by PT    Recommendations for Other Services       Precautions / Restrictions Precautions Precautions: Fall Restrictions Weight Bearing Restrictions: Yes RLE Weight Bearing: Weight bearing as tolerated      Mobility  Bed Mobility Overal bed mobility: Needs Assistance Bed Mobility: Supine to Sit     Supine to sit: Min assist;HOB elevated     General bed mobility comments: Min assist w/ bringing RLE to EOB.  Pt relies heavily on handrails w/ HOB elevated.  Increased time and verbal cues for hand placement and sequencing.  Transfers Overall transfer level: Needs assistance Equipment used: Rolling walker (2 wheeled) Transfers: Sit to/from Stand Sit to Stand: Min guard         General transfer comment: Verbal cues for proper hand placement and to stand upright.  Ambulation/Gait Ambulation/Gait assistance: Min guard Ambulation Distance (Feet): 5 Feet Assistive device: Rolling walker (2 wheeled) Gait Pattern/deviations: Step-to pattern;Decreased stride length;Decreased stance time - right;Antalgic;Trunk flexed   Gait velocity interpretation: Below normal speed for age/gender General Gait Details: Verbal cues for sequencing using RW and to keep it close to her body while ambulating and during transfers.   Decreased R knee flexion.  Stairs            Wheelchair Mobility    Modified Rankin (Stroke Patients Only)       Balance Overall balance assessment: Needs assistance Sitting-balance support: Bilateral upper extremity supported;Feet supported Sitting balance-Leahy Scale: Fair     Standing balance support: Bilateral upper extremity supported Standing balance-Leahy Scale: Fair                               Pertinent Vitals/Pain Pain Assessment: 0-10 Pain Score: 6  Pain Location: R knee Pain Descriptors / Indicators: Aching;Discomfort;Grimacing;Guarding;Heaviness;Moaning Pain Intervention(s): Repositioned;Monitored during session;Limited activity within patient's tolerance    Home Living Family/patient expects to be discharged to:: Private residence Living Arrangements: Spouse/significant other Available Help at Discharge: Family;Available 24 hours/day (husband: Richardson Landry) Type of Home: House Home Access: Stairs to enter Entrance Stairs-Rails: None Entrance Stairs-Number of Steps: 2 Home Layout: One level Home Equipment: Walker - 2 wheels;Bedside commode;Cane - single point      Prior Function Level of Independence: Independent with assistive device(s) (cane use intermittent)               Hand Dominance   Dominant Hand: Right    Extremity/Trunk Assessment               Lower Extremity Assessment: RLE deficits/detail RLE Deficits / Details: as expected s/p R TKA       Communication   Communication: No difficulties  Cognition Arousal/Alertness: Awake/alert Behavior During Therapy: WFL for tasks assessed/performed Overall Cognitive Status: Within Functional Limits for  tasks assessed                      General Comments      Exercises Total Joint Exercises Ankle Circles/Pumps: AROM;Both;10 reps;Supine Quad Sets: AROM;Both;10 reps;Supine Heel Slides: AAROM;Right;5 reps;Supine Knee Flexion: AROM;Right;5  reps;Seated Goniometric ROM: 8-99      Assessment/Plan    PT Assessment Patient needs continued PT services  PT Diagnosis Difficulty walking;Abnormality of gait;Generalized weakness;Acute pain   PT Problem List Decreased strength;Decreased range of motion;Decreased activity tolerance;Decreased balance;Decreased mobility;Decreased knowledge of use of DME;Decreased safety awareness;Decreased knowledge of precautions;Obesity;Pain  PT Treatment Interventions DME instruction;Gait training;Stair training;Functional mobility training;Therapeutic activities;Therapeutic exercise;Balance training;Neuromuscular re-education;Patient/family education;Modalities   PT Goals (Current goals can be found in the Care Plan section) Acute Rehab PT Goals Patient Stated Goal: none stated PT Goal Formulation: With patient/family Time For Goal Achievement: 08/17/14 Potential to Achieve Goals: Good    Frequency 7X/week   Barriers to discharge Inaccessible home environment 2 steps to enter home    Co-evaluation               End of Session Equipment Utilized During Treatment: Gait belt Activity Tolerance: Patient tolerated treatment well Patient left: in chair;with call bell/phone within reach;with family/visitor present Nurse Communication: Mobility status;Precautions;Weight bearing status         Time: 1711-1733 PT Time Calculation (min) (ACUTE ONLY): 22 min   Charges:   PT Evaluation $Initial PT Evaluation Tier I: 1 Procedure     PT G CodesJoslyn Mitchell PT, DPT [phone] Pager: [phone]  08/10/2014, 5:51 PM

## 2014-08-11 ENCOUNTER — Encounter (HOSPITAL_COMMUNITY): Payer: Self-pay | Admitting: Orthopedic Surgery

## 2014-08-11 LAB — GLUCOSE, CAPILLARY
Glucose-Capillary: 191 mg/dL — ABNORMAL HIGH (ref 70–99)
Glucose-Capillary: 167 mg/dL — ABNORMAL HIGH (ref 70–99)
Glucose-Capillary: 200 mg/dL — ABNORMAL HIGH (ref 70–99)

## 2014-08-11 LAB — HEMOGLOBIN A1C
Hgb A1c MFr Bld: 6.7 % — ABNORMAL HIGH (ref 4.8–5.6)
Mean Plasma Glucose: 146 mg/dL

## 2014-08-11 MED ORDER — OXYCODONE HCL ER 10 MG PO T12A: 10.0000 mg | EXTENDED_RELEASE_TABLET | Freq: Two times a day (BID) | ORAL | Status: DC

## 2014-08-11 MED ORDER — OXYCODONE HCL 5 MG PO TABS: 5.0000 mg | ORAL_TABLET | ORAL | Status: DC | PRN

## 2014-08-11 MED ORDER — METHOCARBAMOL 500 MG PO TABS: 500.0000 mg | ORAL_TABLET | Freq: Four times a day (QID) | ORAL | Status: DC | PRN

## 2014-08-11 MED ORDER — ENOXAPARIN SODIUM 40 MG/0.4ML ~~LOC~~ SOLN: 40.0000 mg | SUBCUTANEOUS | Status: DC

## 2014-08-11 NOTE — Progress Notes (Signed)
Assumed care of pt at this time.  Report obtained from [facility] who states, "will catch up patient meds".

## 2014-08-11 NOTE — Progress Notes (Signed)
Call received from PA regarding hemovac pulled out.  No orders at this time. Will reinforced PRN.  Will continuous to monitor

## 2014-08-11 NOTE — Progress Notes (Signed)
DC instructions given to pt at this time re: f/u appts, diet, activity, home meds, pain control, incision care.  Pt verbalized understanding of all instructions.  No s/s of any acute distress noted at this time.

## 2014-08-11 NOTE — Progress Notes (Signed)
Occupational Therapy Evaluation Patient Details Name: Joanne Mitchell MRN: [id number] DOB: 1945-05-17 Today's Date: 08/11/2014    History of Present Illness Pt is a 69 y/o F s/p R TKA.  Pt's PMH includes PONV, HTN, GERD, DM.   Clinical Impression   Completed all education regarding compensatory techniques for ADL and functional mobility for ADL. Husband present for session. Pt ready to D/C home from OT standpoint.     Follow Up Recommendations  No OT follow up;Supervision - Intermittent    Equipment Recommendations  None recommended by OT    Recommendations for Other Services       Precautions / Restrictions Precautions Precautions: Fall Restrictions RLE Weight Bearing: Weight bearing as tolerated      Mobility Bed Mobility               General bed mobility comments:  (up in chair)  Transfers Overall transfer level: Needs assistance Equipment used: Rolling walker (2 wheeled) Transfers: Sit to/from [facility] Sit to Stand: Supervision Stand pivot transfers: Supervision       General transfer comment: Min guard for safety. Cues for hand placement.    Balance     Sitting balance-Leahy Scale: Fair                                      ADL Overall ADL's : Needs assistance/impaired             Lower Body Bathing: Minimal assistance;Sit to/from stand       Lower Body Dressing: Minimal assistance   Toilet Transfer: Supervision/safety Toilet Transfer Details (indicate cue type and reason): vc to step back with strong nonoperated leg Toileting- Clothing Manipulation and Hygiene: Supervision/safety;Sit to/from stand   Tub/ Shower Transfer: Min guard   Functional mobility during ADLs: Supervision/safety General ADL Comments: Educated pt/husband on safe walk in shower transfer techniques. Pt able to return demonstrate. Educated on compensatory techniques for LB ADL, Also educated on home safety and reducing risk of  falls. Pt/husband verbalized understanding.                     Pertinent Vitals/Pain Pain Assessment: 0-10 Pain Score: 4  Pain Location: R knee Pain Descriptors / Indicators: Aching Pain Intervention(s): Limited activity within patient's tolerance;Repositioned;Ice applied     Hand Dominance Right   Extremity/Trunk Assessment Upper Extremity Assessment Upper Extremity Assessment: Overall WFL for tasks assessed   Lower Extremity Assessment Lower Extremity Assessment: Defer to PT evaluation   Cervical / Trunk Assessment Cervical / Trunk Assessment: Normal   Communication Communication Communication: No difficulties   Cognition Arousal/Alertness: Awake/alert Behavior During Therapy: WFL for tasks assessed/performed Overall Cognitive Status: Within Functional Limits for tasks assessed                                        Home Living Family/patient expects to be discharged to:: Private residence Living Arrangements: Spouse/significant other Available Help at Discharge: Family;Available 24 hours/day Type of Home: House Home Access: Stairs to enter [employer] of Steps: 2 Entrance Stairs-Rails: None Home Layout: One level     Bathroom Shower/Tub: Occupational psychologist: Handicapped height Bathroom Accessibility: Yes How Accessible: Accessible via walker Home Equipment: [place] - 2 wheels;Bedside commode;Cane - single point  Prior Functioning/Environment Level of Independence: Independent with assistive device(s)             OT Diagnosis: Generalized weakness;Acute pain   OT Problem List: Decreased strength;Decreased range of motion;Decreased activity tolerance;Decreased knowledge of use of DME or AE;Obesity;Pain   OT Treatment/Interventions:      OT Goals(Current goals can be found in the care plan section) Acute Rehab OT Goals Patient Stated Goal: to go home today OT Goal Formulation: All assessment and  education complete, DC therapy  OT Frequency:     Barriers to D/C:            Co-evaluation              End of Session Equipment Utilized During Treatment: Gait belt;Rolling walker CPM Left Knee CPM Left Knee: Off Nurse Communication: Mobility status  Activity Tolerance: Patient tolerated treatment well Patient left: in chair;with call bell/phone within reach;with family/visitor present   Time: 7782-4235 OT Time Calculation (min): 12 min Charges:  OT General Charges $OT Visit: 1 Procedure OT Evaluation $Initial OT Evaluation Tier I: 1 Procedure G-Codes:    Har[employer],HILLARY 07-Sep-2014, 1:41 PM   [facility], OTR/L  [phone] 09/07/14

## 2014-08-11 NOTE — Progress Notes (Signed)
Physical Therapy Treatment Patient Details Name: Joanne Mitchell MRN: [id number] DOB: February 04, 1946 Today's Date: 08/11/2014    History of Present Illness Pt is a 69 y/o F s/p R TKA.  Pt's PMH includes PONV, HTN, GERD, DM.    PT Comments    Pt progressing toward goals and increasing ambulation distance. Pt able to safely complete stair training with husband present. Pt and husband independent with stairs using walker. Pt safe to discharge from a mobility standpoint based on progression towards goals set on PT eval.  Follow Up Recommendations  Outpatient PT;Supervision/Assistance - 24 hour     Equipment Recommendations  None recommended by PT    Recommendations for Other Services       Precautions / Restrictions Precautions Precautions: Fall Restrictions Weight Bearing Restrictions: Yes RLE Weight Bearing: Weight bearing as tolerated    Mobility  Bed Mobility Overal bed mobility: Needs Assistance Bed Mobility: Supine to Sit     Supine to sit: Min assist;HOB elevated     General bed mobility comments: Min A for RLE. Pt used rails with HOB elevated to position trunk in upright position.  Transfers Overall transfer level: Needs assistance Equipment used: Rolling walker (2 wheeled)   Sit to Stand: Min guard         General transfer comment: Min guard for safety. Cues for hand placement.  Ambulation/Gait Ambulation/Gait assistance: Min guard Ambulation Distance (Feet): 200 Feet Assistive device: Rolling walker (2 wheeled) Gait Pattern/deviations: Step-to pattern;Decreased stride length;Decreased stance time - right;Antalgic   Gait velocity interpretation: Below normal speed for age/gender General Gait Details: Cues for sequencing and to keep RW moving.   Stairs            Wheelchair Mobility    Modified Rankin (Stroke Patients Only)       Balance                                    Cognition Arousal/Alertness: Awake/alert Behavior  During Therapy: WFL for tasks assessed/performed Overall Cognitive Status: Within Functional Limits for tasks assessed                      Exercises Total Joint Exercises Quad Sets: AROM;Both;10 reps Short Arc Quad: Right;10 reps;AAROM Heel Slides: AAROM;Right;10 reps Hip ABduction/ADduction: AROM;Right;10 reps Straight Leg Raises: AAROM;Right;10 reps    General Comments        Pertinent Vitals/Pain Pain Assessment: 0-10 Pain Score: 5  Pain Location: R knee Pain Descriptors / Indicators: Sore Pain Intervention(s): Monitored during session    Home Living                      Prior Function            PT Goals (current goals can now be found in the care plan section) Progress towards PT goals: Progressing toward goals    Frequency  7X/week    PT Plan Current plan remains appropriate    Co-evaluation             End of Session Equipment Utilized During Treatment: Gait belt Activity Tolerance: Patient tolerated treatment well Patient left: in chair;with call bell/phone within reach;with family/visitor present     Time: 0109-3235 PT Time Calculation (min) (ACUTE ONLY): 25 min  Charges:  $Gait Training: 8-22 mins $Therapeutic Exercise: 8-22 mins  G CodesRubye Oaks, [place] 08/11/2014, 11:36 AM

## 2014-08-11 NOTE — Progress Notes (Signed)
Pt leaves floor at this time, accompanied by husband for DC to car.

## 2014-08-11 NOTE — Progress Notes (Signed)
Physical Therapy Treatment Patient Details Name: Joanne Mitchell MRN: [id number] DOB: 23-Jul-1945 Today's Date: 08/11/2014    History of Present Illness Pt is a 69 y/o F s/p R TKA.  Pt's PMH includes PONV, HTN, GERD, DM.    PT Comments    Pt is progressing toward goals and increasing ambulation distance. Pt would benefit from further PT to increase functional independence. Attempt progressive ambulation and stair training this afternoon.  Follow Up Recommendations  Outpatient PT;Supervision/Assistance - 24 hour     Equipment Recommendations  None recommended by PT    Recommendations for Other Services       Precautions / Restrictions Precautions Precautions: Fall Restrictions Weight Bearing Restrictions: Yes RLE Weight Bearing: Weight bearing as tolerated    Mobility  Bed Mobility Overal bed mobility: Needs Assistance Bed Mobility: Supine to Sit     Supine to sit: Min assist;HOB elevated     General bed mobility comments: Min A for RLE. Pt used rails with HOB elevated to position trunk in upright position.  Transfers Overall transfer level: Needs assistance Equipment used: Rolling walker (2 wheeled)   Sit to Stand: Min guard         General transfer comment: Cues for hand placement and position of RLE.  Ambulation/Gait Ambulation/Gait assistance: Min guard Ambulation Distance (Feet): 100 Feet Assistive device: Rolling walker (2 wheeled) Gait Pattern/deviations: Step-to pattern;Decreased stride length;Decreased stance time - right;Antalgic   Gait velocity interpretation: Below normal speed for age/gender General Gait Details: Cues for sequencing and to keep RW moving.   Stairs            Wheelchair Mobility    Modified Rankin (Stroke Patients Only)       Balance                                    Cognition Arousal/Alertness: Awake/alert Behavior During Therapy: WFL for tasks assessed/performed Overall Cognitive Status:  Within Functional Limits for tasks assessed                      Exercises Total Joint Exercises Quad Sets: AROM;Both;10 reps Short Arc Quad: Right;10 reps;AAROM Heel Slides: AAROM;Right;10 reps Hip ABduction/ADduction: AROM;Right;10 reps Straight Leg Raises: AAROM;Right;10 reps    General Comments        Pertinent Vitals/Pain Pain Assessment: 0-10 Pain Score: 5  Pain Location: R knee Pain Descriptors / Indicators: Sore Pain Intervention(s): Monitored during session;Premedicated before session    Home Living                      Prior Function            PT Goals (current goals can now be found in the care plan section) Progress towards PT goals: Progressing toward goals    Frequency  7X/week    PT Plan Current plan remains appropriate    Co-evaluation             End of Session Equipment Utilized During Treatment: Gait belt Activity Tolerance: Patient tolerated treatment well Patient left: in chair;with call bell/phone within reach     Time: 0834-0902 PT Time Calculation (min) (ACUTE ONLY): 28 min  Charges:                       G CodesRubye Oaks, SPTA 08/11/2014, 9:17 AM

## 2014-08-11 NOTE — Progress Notes (Signed)
SPORTS MEDICINE AND JOINT REPLACEMENT  Lara Mulch, MD   Carlynn Spry, PA-C [place], [place], [place]  [address]                             [phone]   PROGRESS NOTE  Subjective:  negative for Chest Pain  negative for Shortness of Breath  negative for Nausea/Vomiting   negative for Calf Pain  negative for Bowel Movement   Tolerating Diet: yes         Patient reports pain as 4 on 0-10 scale.    Objective: Vital signs in last 24 hours:   Patient Vitals for the past 24 hrs:  BP Temp Temp src Pulse Resp SpO2  08/11/14 1200 - - - - 20 95 %  08/11/14 0824 (!) 146/67 mmHg - - - - -  08/11/14 0508 (!) 145/67 mmHg 98.2 F (36.8 C) - 98 16 95 %  08/11/14 0400 - - - - 16 95 %  08/11/14 0102 (!) 141/74 mmHg 98.6 F (37 C) Oral 92 14 95 %  08/11/14 0000 - - - - 16 95 %  08/10/14 2008 (!) 152/72 mmHg 97.9 F (36.6 C) Oral 89 16 98 %  08/10/14 2000 - - - - 16 95 %    @flow {1959:LAST@   Intake/Output from previous day:   04/11 0701 - 04/12 0700 In: 2257.5 [I.V.:2257.5] Out: 100 [Drains:50]   Intake/Output this shift:   04/12 0701 - 04/12 1900 In: 480 [P.O.:480] Out: -    Intake/Output      04/11 0701 - 04/12 0700 04/12 0701 - 04/13 0700   P.O.  480   I.V. 2257.5    Total Intake 2257.5 480   Urine 0    Drains 50    Blood 50    Total Output 100     Net +2157.5 +480        Urine Occurrence 5 x 1 x      LABORATORY DATA:  Recent Labs  08/10/14 1219  WBC 19.0*  HGB 13.8  HCT 42.8  PLT 262    Recent Labs  08/10/14 1219  CREATININE 0.73   Lab Results  Component Value Date   INR 1.11 07/31/2014    Examination:  General appearance: alert, cooperative and no distress Extremities: Homans sign is negative, no sign of DVT  Wound Exam: clean, dry, intact or erythematous   Drainage:  Scant/small amount Serosanguinous exudate  Motor Exam: EHL and FHL Intact  Sensory Exam: Deep Peroneal normal   Assessment:    1 Day Post-Op   Procedure(s) (LRB): TOTAL KNEE ARTHROPLASTY (Right)  ADDITIONAL DIAGNOSIS:  Active Problems:   S/P total knee arthroplasty  Acute Blood Loss Anemia   Plan: Physical Therapy as ordered Weight Bearing as Tolerated (WBAT)  DVT Prophylaxis:  Lovenox  DISCHARGE PLAN: Home  DISCHARGE NEEDS: HHPT, CPM, Walker and 3-in-1 comode seat         Charity Tessier 08/11/2014, 2:48 PM

## 2014-08-12 NOTE — Addendum Note (Signed)
Addendum  created 08/12/14 2258 by Kate Sable, MD   Modules edited: Anesthesia Events, Narrator   Narrator:  Narrator: Event Log Edited

## 2014-08-13 NOTE — Discharge Summary (Signed)
[facility]   Joanne Mulch, MD   Joanne Spry, PA-C [place], [place], [place]  [address]                             [phone]  PATIENT ID: Joanne Mitchell        MRN:  [id number]          DOB/AGE: 11/29/1945 / 69 y.o.    DISCHARGE SUMMARY  ADMISSION DATE:    08/10/2014 DISCHARGE DATE:  08/11/2014  ADMISSION DIAGNOSIS: primary osteoarthritis right knee    DISCHARGE DIAGNOSIS:  primary osteoarthritis right knee    ADDITIONAL DIAGNOSIS: Active Problems:   S/P total knee arthroplasty  Past Medical History  Diagnosis Date  . Complication of anesthesia     novacaine /w epinephrine caused her heart to race, she remarks about post op, she had N&V  . PONV (postoperative nausea and vomiting)   . Hypertension   . 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 Bettina Gavia   . Phlebitis     during pregnancy - 45 yrs. ago  . Anxiety     uses Ativan 4-5x's per month  . GERD (gastroesophageal reflux disease)   . Arthritis     knee- R  . Maternal blood transfusion     45 yrs. ago   . Diabetes mellitus without complication     TYPE 2    PROCEDURE: Procedure(s): TOTAL KNEE ARTHROPLASTY on 08/10/2014  CONSULTS:     HISTORY:  See H&P in chart  HOSPITAL COURSE:  Joanne Mitchell is a 69 y.o. admitted on 08/10/2014 and found to have a diagnosis of primary osteoarthritis right knee.  After appropriate laboratory studies were obtained  they were taken to the operating room on 08/10/2014 and underwent Procedure(s): TOTAL KNEE ARTHROPLASTY.   They were given perioperative antibiotics:  Anti-infectives    Start     Dose/Rate Route Frequency Ordered Stop   08/10/14 1115  ceFAZolin (ANCEF) IVPB 2 g/50 mL premix     2 g 100 mL/hr over 30 Minutes Intravenous Every 6 hours 08/10/14 1111 08/10/14 2314   08/10/14 0600  ceFAZolin (ANCEF) IVPB 2 g/50 mL premix     2 g 100 mL/hr  over 30 Minutes Intravenous On call to O.R. 08/09/14 1314 08/10/14 0730    .  Tolerated the procedure well.  Placed with a foley intraoperatively.  Given Ofirmev at induction and for 48 hours.    POD# 1: Vital signs were stable.  Patient denied Chest pain, shortness of breath, or calf pain.  Patient was started on Lovenox 30 mg subcutaneously twice daily at 8am.  Consults to PT, OT, and care management were made.  The patient was weight bearing as tolerated.  CPM was placed on the operative leg 0-90 degrees for 6-8 hours a day.  Incentive spirometry was taught.  Dressing was changed.  Hemovac was discontinued.      POD #2, Continued  PT for ambulation and exercise program.  IV saline locked.  O2 discontinued.    The remainder of the hospital course was dedicated to ambulation and strengthening.   The patient was discharged on 1 day post op in  Good condition.  Blood products given:none  DIAGNOSTIC STUDIES: Recent vital signs: No data found.  Recent laboratory studies:  Recent Labs  08/10/14 1219  WBC 19.0*  HGB 13.8  HCT 42.8  PLT 262    Recent Labs  08/10/14 1219  CREATININE 0.73   Lab Results  Component Value Date   INR 1.11 07/31/2014     Recent Radiographic Studies :  Dg Chest 2 View  07/31/2014   CLINICAL DATA:  Preoperative examination prior to knee replacement  EXAM: CHEST  2 VIEW  COMPARISON:  PA and lateral chest x-ray of May 31, 2009  FINDINGS: The lungs are well-expanded and clear. The heart is mildly enlarged. The pulmonary vascularity is normal. The mediastinum is normal in width. There is no pleural effusion or pneumothorax. There is multilevel degenerative disc disease of the thoracic spine.  IMPRESSION: Mild enlargement of cardiac silhouette without evidence of pulmonary edema nor other active cardiopulmonary disease.   Electronically Signed   By: Joanne  Mitchell   On: 07/31/2014 09:28    DISCHARGE INSTRUCTIONS: Discharge Instructions    CPM     Complete by:  As directed   Continuous passive motion machine (CPM):      Use the CPM from 0 to 90 for 6-8 hours per day.      You may increase by 10 per day.  You may break it up into 2 or 3 sessions per day.      Use CPM for 2 weeks or until you are told to stop.     Call MD / Call 911    Complete by:  As directed   If you experience chest pain or shortness of breath, CALL 911 and be transported to the hospital emergency room.  If you develope a fever above 101 F, pus (white drainage) or increased drainage or redness at the wound, or calf pain, call your surgeon's office.     Change dressing    Complete by:  As directed   Change dressing on wednesday, then change the dressing daily with sterile 4 x 4 inch gauze dressing and apply TED hose.     Constipation Prevention    Complete by:  As directed   Drink plenty of fluids.  Prune juice may be helpful.  You may use a stool softener, such as Colace (over the counter) 100 mg twice a day.  Use MiraLax (over the counter) for constipation as needed.     Diet - low sodium heart healthy    Complete by:  As directed      Do not put a pillow under the knee. Place it under the heel.    Complete by:  As directed      Driving restrictions    Complete by:  As directed   No driving for 6 weeks     Increase activity slowly as tolerated    Complete by:  As directed      Lifting restrictions    Complete by:  As directed   No lifting for 6 weeks     TED hose    Complete by:  As directed   Use stockings (TED hose) for 3 weeks on both leg(s).  You may remove them at night for sleeping.           DISCHARGE MEDICATIONS:     Medication List    TAKE these medications        amLODipine 5 MG tablet  Commonly known as:  NORVASC  Take 5 mg by mouth daily before breakfast.     aspirin 81  MG tablet  Take 81 mg by mouth daily.     enoxaparin 40 MG/0.4ML injection  Commonly known as:  LOVENOX  Inject 0.4 mLs (40 mg total) into the skin daily.      fluticasone 50 MCG/ACT nasal spray  Commonly known as:  FLONASE  Place 1 spray into both nostrils daily as needed for allergies or rhinitis.     glimepiride 2 MG tablet  Commonly known as:  AMARYL  Take 2 mg by mouth daily with breakfast.     hydrochlorothiazide 12.5 MG capsule  Commonly known as:  MICROZIDE  Take 12.5 mg by mouth daily.     LORazepam 0.5 MG tablet  Commonly known as:  ATIVAN  Take 0.5 mg by mouth 2 (two) times daily as needed for anxiety.     methocarbamol 500 MG tablet  Commonly known as:  ROBAXIN  Take 1-2 tablets (500-1,000 mg total) by mouth every 6 (six) hours as needed for muscle spasms.     multivitamin with minerals tablet  Take 1 tablet by mouth daily.     omeprazole 20 MG capsule  Commonly known as:  PRILOSEC  Take 20 mg by mouth daily as needed (acid reflux).     oxyCODONE 5 MG immediate release tablet  Commonly known as:  Oxy IR/ROXICODONE  Take 1-2 tablets (5-10 mg total) by mouth every 3 (three) hours as needed for breakthrough pain.     OxyCODONE 10 mg T12a 12 hr tablet  Commonly known as:  OXYCONTIN  Take 1 tablet (10 mg total) by mouth every 12 (twelve) hours.     predniSONE 10 MG tablet  Commonly known as:  DELTASONE  Take 10 mg by mouth See admin instructions. Tapered Dose        FOLLOW UP VISIT:       Follow-up Information    Follow up with Rudean Haskell, MD. Call on 08/25/2014.   Specialty:  Orthopedic Surgery   Contact information:   [place] [address] [phone]       Follow up with Rudean Haskell, MD. Call on 08/25/2014.   Specialty:  Orthopedic Surgery   Contact information:   [address] [place] [address] [phone]       DISPOSITION: HOME   CONDITION:  Good   Gwin Eagon 08/13/2014, 2:46 PM

## 2015-08-17 DIAGNOSIS — E119 Type 2 diabetes mellitus without complications: Secondary | ICD-10-CM

## 2015-08-17 DIAGNOSIS — I10 Essential (primary) hypertension: Secondary | ICD-10-CM

## 2015-08-17 DIAGNOSIS — M159 Polyosteoarthritis, unspecified: Secondary | ICD-10-CM

## 2015-08-17 DIAGNOSIS — N393 Stress incontinence (female) (male): Secondary | ICD-10-CM | POA: Insufficient documentation

## 2015-08-17 DIAGNOSIS — R5381 Other malaise: Secondary | ICD-10-CM | POA: Insufficient documentation

## 2015-08-17 DIAGNOSIS — R5383 Other fatigue: Secondary | ICD-10-CM

## 2015-08-17 DIAGNOSIS — F419 Anxiety disorder, unspecified: Secondary | ICD-10-CM | POA: Insufficient documentation

## 2015-08-17 DIAGNOSIS — J3089 Other allergic rhinitis: Secondary | ICD-10-CM | POA: Insufficient documentation

## 2015-08-17 DIAGNOSIS — M8949 Other hypertrophic osteoarthropathy, multiple sites: Secondary | ICD-10-CM

## 2015-08-17 DIAGNOSIS — E782 Mixed hyperlipidemia: Secondary | ICD-10-CM

## 2015-08-17 DIAGNOSIS — M150 Primary generalized (osteo)arthritis: Secondary | ICD-10-CM

## 2015-08-17 HISTORY — DX: Other malaise: R53.81

## 2015-08-17 HISTORY — DX: Other allergic rhinitis: J30.89

## 2015-08-17 HISTORY — DX: Stress incontinence (female) (male): N39.3

## 2015-08-17 HISTORY — DX: Primary generalized (osteo)arthritis: M15.0

## 2015-08-17 HISTORY — DX: Type 2 diabetes mellitus without complications: E11.9

## 2015-08-17 HISTORY — DX: Mixed hyperlipidemia: E78.2

## 2015-08-17 HISTORY — DX: Essential (primary) hypertension: I10

## 2015-08-17 HISTORY — DX: Polyosteoarthritis, unspecified: M15.9

## 2015-08-17 HISTORY — DX: Other hypertrophic osteoarthropathy, multiple sites: M89.49

## 2016-05-04 DIAGNOSIS — R1084 Generalized abdominal pain: Secondary | ICD-10-CM

## 2016-05-04 DIAGNOSIS — R1013 Epigastric pain: Secondary | ICD-10-CM

## 2016-05-04 HISTORY — DX: Generalized abdominal pain: R10.84

## 2016-05-04 HISTORY — DX: Epigastric pain: R10.13

## 2016-11-21 ENCOUNTER — Other Ambulatory Visit: Payer: Self-pay

## 2016-11-21 MED ORDER — PRAVASTATIN SODIUM 20 MG PO TABS: 20.0000 mg | ORAL_TABLET | Freq: Every day | ORAL | 2 refills | Status: DC

## 2017-05-04 DIAGNOSIS — K2100 Gastro-esophageal reflux disease with esophagitis, without bleeding: Secondary | ICD-10-CM | POA: Insufficient documentation

## 2017-05-04 HISTORY — DX: Gastro-esophageal reflux disease with esophagitis, without bleeding: K21.00

## 2017-09-05 DIAGNOSIS — R531 Weakness: Secondary | ICD-10-CM

## 2017-09-05 HISTORY — DX: Weakness: R53.1

## 2017-09-17 ENCOUNTER — Telehealth: Payer: Self-pay | Admitting: Cardiology

## 2017-09-17 MED ORDER — PRAVASTATIN SODIUM 20 MG PO TABS: 20.0000 mg | ORAL_TABLET | Freq: Every day | ORAL | 0 refills | Status: DC

## 2017-09-17 NOTE — Telephone Encounter (Signed)
Refill sent.

## 2017-09-17 NOTE — Telephone Encounter (Signed)
°*  STAT* If patient is at the pharmacy, call can be transferred to refill team.   1. Which medications need to be refilled? (please list name of each medication and dose if known) pravastatin 20mg  take 1 daily  2. Which pharmacy/location (including street and city if local pharmacy) is medication to be sent to? [facility] on [address]   3. Do they need a 30 day or 90 day supply? [place]

## 2017-09-18 DIAGNOSIS — I493 Ventricular premature depolarization: Secondary | ICD-10-CM | POA: Insufficient documentation

## 2017-09-18 HISTORY — DX: Ventricular premature depolarization: I49.3

## 2017-09-18 NOTE — Progress Notes (Signed)
Cardiology Office Note:    Date:  09/19/2017   ID:  Joanne Mitchell, DOB December 27, 1945, MRN [id number]  PCP:  Joanne Mitchell., MD  Cardiologist:  Joanne More, MD    Referring MD: Joanne Mitchell., MD    ASSESSMENT:    1. Essential hypertension   2. Frequent PVCs   3. Mixed hyperlipidemia   4. Type 2 diabetes mellitus without complication, without long-term current use of insulin (HCC)   5. Vertigo    PLAN:    In order of problems listed above:  1. Stable continue current treatment I would not intensify her antihypertensive with her CNS symptoms. 2. Stable asymptomatic 3. Continue her statins lipids are at target 4. Hemoglobin A1c is relatively low I suspect she is having symptomatic hypoglycemia and may benefit from checking home blood sugars with her CNS symptoms and withdrawing oral agent. 5. See discussion above   Next appointment: 3 months   Medication Adjustments/Labs and Tests Ordered: Current medicines are reviewed at length with the patient today.  Concerns regarding medicines are outlined above.  Orders Placed This Encounter  Procedures  . EKG 12-Lead   No orders of the defined types were placed in this encounter.   Chief Complaint  Patient presents with  . Follow-up    History of Present Illness:    Joanne Mitchell is a 72 y.o. female with a hx of Dyslipidemia, HTN, Palpitations and PVC's  last seen 08/01/16.  Diagnoses and all orders for this visit:  Palpitation stable improved, she has frequent PVCs will avoid over-the-counter proarrhythmic drugs and at this point utilizing beta blocker when necessary for bothersome symptoms. - metoprolol tartrate (LOPRESSOR) 25 MG tablet; Take 1 tablet (25 mg total) by mouth two (2) times a day as needed. Mixed hyperlipidemia ([facility]), nonHDL > 100, I advised a statin, she agrees and will start low-dose of a low intensity statin and I'll ask her family doctor to check a lipid profile at the next visit -  pravastatin (PRAVACHOL) 20 MG tablet; Take 1 tablet (20 mg total) by mouth daily. Essential hypertension stable continue current treatment  Compliance with diet, lifestyle and medications: Yes  She recently had an episode that she interpreted as vertigo where she felt very weak as if she moves consciousness associated with headache and some nausea.  Since then she has had recurrent episodes but not as severe she is seen by her PCP evaluated no discrete cause.  She has type 2 diabetes she is lost 65 pounds her hemoglobin A1c is 5.8 and I suspect she is having symptomatic hypoglycemia.  I will ask her PCP to consider withdrawing her current treatment and have asked her to check blood sugars when she has these episodes.  She has mild orthostatic symptoms but no shift and blood pressure my office.  No chest pain shortness of breath palpitation or syncope. Past Medical History:  Diagnosis Date  . Anxiety    uses Ativan 4-5x's per month  . Arthritis    knee- R  . Complication of anesthesia    novacaine /w epinephrine caused her heart to race, she remarks about post op, she had N&V  . Diabetes mellitus without complication ([place])    TYPE 2  . Dyspepsia 05/04/2016  . Essential hypertension 08/17/2015   Last Assessment & Plan:  TRelevant Hx: Course: Daily Update: Today's Plan:this is stable for her at this time and she is taking her meds as directed and will continue to follow for her  see her CMP today  Electronically signed by: Joanne Camel, NP 09/16/15 [phone]  . Gastroesophageal reflux disease with esophagitis 05/04/2017  . Generalized abdominal pain 05/04/2016  . GERD (gastroesophageal reflux disease)   . History of stress test    echo /w stress test, on treadmill , pt. 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 Bettina Gavia   . Hypertension   . Malaise and fatigue 08/17/2015   Last Assessment & Plan:  Relevant Hx: Course: Daily Update: Today's Plan:she still works about 4 day weeks  but there is a lot of stress at work and that is tiring to her she is trying to rest Mitchell but still has some ease to tire will see her labs  Electronically signed by: Joanne Camel, NP 09/16/15 [phone] Overview:  Last Assessment & Plan:  Relevant Hx: Course: Daily Update: Today's Pl  . Maternal blood transfusion    45 yrs. ago   . Mixed hyperlipidemia 08/17/2015   Last Assessment & Plan:  Relevant Hx: Course: Daily Update: Today's Plan:update her labs for her today  Electronically signed by: Joanne Camel, NP 09/16/15 [phone] Overview:  Last Assessment & Plan:  Relevant Hx: Course: Daily Update: Today's Plan:update her labs for her today  Electronically signed by: Joanne Camel, NP 09/16/15 [phone]  . Non-seasonal allergic rhinitis 08/17/2015   Last Assessment & Plan:  Relevant Hx: Course: Daily Update: Today's Plan:she has been having a difficult time with this and she is advised to use her flonase in the am and take zyrtec at [facility] and see if that helps control the drainage for her  Electronically signed by: Joanne Camel, NP 09/16/15 [phone]  . Phlebitis    during pregnancy - 45 yrs. ago  . PONV (postoperative nausea and vomiting)   . Primary osteoarthritis involving multiple joints 08/17/2015   Last Assessment & Plan:  Relevant Hx: Course: Daily Update: Today's Plan:she is better with her knees her hips do bother her some but she feels that is better since her surgery and increasing her exercise  Electronically signed by: Joanne Camel, NP 09/16/15 [phone] Overview:  Last Assessment & Plan:  Relevant Hx: Course: Daily Update: Today's Plan:she is better with her knees her hips d  . 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 Stress incontinence 08/17/2015   Last Assessment & Plan:  Relevant Hx: Course: Daily Update: Today's Plan:she feels this is stable for her at this time and will follow  Electronically signed by: Joanne Camel, NP 09/16/15 0950   . Type 2 diabetes mellitus without complications ([place]) 11/04/2374   Last Assessment & Plan:  Relevant Hx: Course: Daily Update: Today's Plan:discussed her sugars with her she is doing better now that she is back at the [facility] and she is working on her exercise since her knee surgery, she did so well with. She is hopeful to keep losing weight. She is having stable sugars despite having bronchitis as well  Electronically signed by: Joanne Camel, NP 05/18  . Weakness 09/05/2017    Past Surgical History:  Procedure Laterality Date  . APPENDECTOMY    . CATARACT EXTRACTION    . CHOLECYSTECTOMY    . HERNIA REPAIR     umbilical   . JOINT REPLACEMENT Left 2009  . TOTAL KNEE ARTHROPLASTY Right 08/10/2014   Procedure: TOTAL KNEE ARTHROPLASTY;  Surgeon: Vickey Huger, MD;  Location: [facility];  Service: Orthopedics;  Laterality: Right;    Current Medications:  Current Meds  Medication Sig  . amLODipine (NORVASC) 5 MG tablet Take 5 mg by mouth daily before breakfast.   . aspirin 81 MG tablet Take 81 mg by mouth daily.  . Difluprednate (DUREZOL) 0.05 % EMUL Apply to eye.  . fluticasone (FLONASE) 50 MCG/ACT nasal spray Place 1 spray into both nostrils daily as needed for allergies or rhinitis.  Marland Kitchen glimepiride (AMARYL) 2 MG tablet Take 2 mg by mouth daily with breakfast.  . LORazepam (ATIVAN) 0.5 MG tablet Take 0.5 mg by mouth 2 (two) times daily as needed for anxiety.  . moxifloxacin (VIGAMOX) 0.5 % ophthalmic solution INSTILL 1 DROP INTO LEFT EYE QID  . Multiple Vitamins-Minerals (MULTIVITAMIN WITH MINERALS) tablet Take 1 tablet by mouth daily.  Marland Kitchen omeprazole (PRILOSEC) 20 MG capsule Take 20 mg by mouth daily as needed (acid reflux).  . pravastatin (PRAVACHOL) 20 MG tablet Take 1 tablet (20 mg total) by mouth daily.  Marland Kitchen PROLENSA 0.07 % SOLN INT 1 GTT REY QPM     Allergies:   Procaine   Social History   Socioeconomic History  . Marital status: Married    Spouse name: Not on file  . Number of  children: Not on file  . Years of education: Not on file  . Highest education level: Not on file  Occupational History  . Not on file  Social Needs  . Financial resource strain: Not on file  . Food insecurity:    Worry: Not on file    Inability: Not on file  . Transportation needs:    Medical: Not on file    Non-medical: Not on file  Tobacco Use  . Smoking status: Never Smoker  . Smokeless tobacco: Never Used  Substance and Sexual Activity  . Alcohol use: Yes    Comment: "glass of wine once & a while"  . Drug use: No  . Sexual activity: Not on file  Lifestyle  . Physical activity:    Days per week: Not on file    Minutes per session: Not on file  . Stress: Not on file  Relationships  . Social connections:    Talks on phone: Not on file    Gets together: Not on file    Attends religious service: Not on file    Active member of club or organization: Not on file    Attends meetings of clubs or organizations: Not on file    Relationship status: Not on file  Other Topics Concern  . Not on file  Social History Narrative  . Not on file     Family History: The patient's family history includes Lung cancer in her mother and sister; Stomach cancer in her paternal grandmother; Stroke in her mother. ROS:   Please see the history of present illness.    All other systems reviewed and are negative.  EKGs/Labs/Other Studies Reviewed:    The following studies were reviewed today:  EKG:  EKG ordered today.  The ekg ordered today demonstrates [facility] and is normal  Recent Labs:  171 HDL 42 LDL 124 Tsh normal A1C 5.8% CMP is normal No results found for requested labs within last 8760 hours.  Recent Lipid Panel No results found for: CHOL, TRIG, HDL, CHOLHDL, VLDL, LDLCALC, LDLDIRECT  Physical Exam:    VS:  BP (!) 158/78 (BP Location: Right Arm, Patient Position: Sitting, Cuff Size: Normal)   Pulse 63   Ht 5\' 3"  (1.6 m)   Wt 204 lb 1.9 oz (92.6 kg)  SpO2 98%   BMI 36.16 kg/m      Wt Readings from Last 3 Encounters:  09/19/17 204 lb 1.9 oz (92.6 kg)  07/31/14 231 lb 4.8 oz (104.9 kg)    BP 148/80 sit and stand GEN:  Well nourished, well developed in no acute distress HEENT: Normal NECK: No JVD; No carotid bruits LYMPHATICS: No lymphadenopathy CARDIAC: RRR, no murmurs, rubs, gallops RESPIRATORY:  Clear to auscultation without rales, wheezing or rhonchi  ABDOMEN: Soft, non-tender, non-distended MUSCULOSKELETAL:  No edema; No deformity  SKIN: Warm and dry NEUROLOGIC:  Alert and oriented x 3 PSYCHIATRIC:  Normal affect    Signed, Joanne More, MD  09/19/2017 9:37 AM    [place]

## 2017-09-19 ENCOUNTER — Encounter: Payer: Self-pay | Admitting: Cardiology

## 2017-09-19 ENCOUNTER — Ambulatory Visit (INDEPENDENT_AMBULATORY_CARE_PROVIDER_SITE_OTHER): Payer: Medicare Other | Admitting: Cardiology

## 2017-09-19 VITALS — BP 158/78 | HR 63 | Ht 63.0 in | Wt 204.1 lb

## 2017-09-19 DIAGNOSIS — I493 Ventricular premature depolarization: Secondary | ICD-10-CM

## 2017-09-19 DIAGNOSIS — E119 Type 2 diabetes mellitus without complications: Secondary | ICD-10-CM

## 2017-09-19 DIAGNOSIS — E782 Mixed hyperlipidemia: Secondary | ICD-10-CM

## 2017-09-19 DIAGNOSIS — R42 Dizziness and giddiness: Secondary | ICD-10-CM

## 2017-09-19 DIAGNOSIS — I10 Essential (primary) hypertension: Secondary | ICD-10-CM

## 2017-09-19 HISTORY — DX: Dizziness and giddiness: R42

## 2017-09-19 NOTE — Patient Instructions (Signed)
Medication Instructions:  Your physician recommends that you continue on your current medications as directed. Please refer to the Current Medication list given to you today.  When you have symptomatic events, please check your blood sugar. If low, contact your primary care physician.  Labwork: None  Testing/Procedures: You had an EKG today.  Follow-Up: Your physician wants you to follow-up in: 3 months. You will receive a reminder letter in the mail two months in advance. If you don't receive a letter, please call our office to schedule the follow-up appointment.  Any Other Special Instructions Will Be Listed Below (If Applicable).     If you need a refill on your cardiac medications before your next appointment, please call your pharmacy.

## 2017-12-14 ENCOUNTER — Telehealth: Payer: Self-pay

## 2017-12-14 MED ORDER — PRAVASTATIN SODIUM 20 MG PO TABS: 20.0000 mg | ORAL_TABLET | Freq: Every day | ORAL | 0 refills | Status: AC

## 2017-12-14 NOTE — Telephone Encounter (Signed)
Rx sent to pharmacy as requested.

## 2018-03-21 DIAGNOSIS — N816 Rectocele: Secondary | ICD-10-CM | POA: Insufficient documentation

## 2018-03-21 HISTORY — DX: Rectocele: N81.6

## 2018-05-09 DIAGNOSIS — D259 Leiomyoma of uterus, unspecified: Secondary | ICD-10-CM | POA: Insufficient documentation

## 2018-05-09 HISTORY — DX: Leiomyoma of uterus, unspecified: D25.9

## 2019-05-28 NOTE — Progress Notes (Signed)
Cardiology Office Note:    Date:  05/29/2019   ID:  Joanne Mitchell, DOB 17-Apr-1946, MRN [id number]  PCP:  Raina Mina., MD  Cardiologist:  Shirlee More, MD    Referring MD: Raina Mina., MD    ASSESSMENT:    1. Frequent PVCs   2. Essential hypertension   3. Mixed hyperlipidemia   4. Atypical chest pain    PLAN:    In order of problems listed above:  1. Continue as needed beta-blocker think she requires a heart rhythm monitor at this time 2. Stable repeat blood pressure by me 140/80.  Home blood pressure runs consistently [id number] or less systolic and continue current treatment long-acting calcium channel blocker 3. Continue her statin lipids are ideal 4. With frequent PVCs EKG T wave changes and atypical symptoms check myocardial perfusion study if abnormal with ischemia may need angiography.   Next appointment: 1 year unless her myocardial perfusion study is abnormal   Medication Adjustments/Labs and Tests Ordered: Current medicines are reviewed at length with the patient today.  Concerns regarding medicines are outlined above.  No orders of the defined types were placed in this encounter.  No orders of the defined types were placed in this encounter.   Chief Complaint  Patient presents with  . Hypertension  . Follow-up    For PVCs    History of Present Illness:    Joanne Mitchell is a 74 y.o. female with a hx of Dyslipidemia, HTN, type 2 diabetes mellitus, palpitations and PVC's last seen 09/19/2017. Compliance with diet, lifestyle and medications: Yes  Overall she is doing well she is taking a beta-blocker as needed basis.  She is concerned because of heartburn and indigestion and concerned about underlying CAD.  Her EKG shows nonspecific T wave changes we will do a myocardial perfusion study in our office.  She also has intermittent shortness of breath with physical activity no edema orthopnea or syncope.  Recently she has not needed a  beta-blocker.  Recent labs PCP office are 05/13/2019 show creatinine 0.61 potassium 4.7 normal GFR liver function test cholesterol 160 HDL 40 LDL 102 and TSH normal Past Medical History:  Diagnosis Date  . Anxiety    uses Ativan 4-5x's per month  . Arthritis    knee- R  . Complication of anesthesia    novacaine /w epinephrine caused her heart to race, she remarks about post op, she had N&V  . Diabetes mellitus without complication ([place])    TYPE 2  . Dyspepsia 05/04/2016  . Essential hypertension 08/17/2015   Last Assessment & Plan:  TRelevant Hx: Course: Daily Update: Today's Plan:this is stable for her at this time and she is taking her meds as directed and will continue to follow for her see her CMP today  Electronically signed by: Mayer Camel, NP 09/16/15 [phone]  . Gastroesophageal reflux disease with esophagitis 05/04/2017  . Generalized abdominal pain 05/04/2016  . GERD (gastroesophageal reflux disease)   . History of stress test    echo /w stress test, on treadmill , pt. 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 Bettina Gavia   . Hypertension   . Malaise and fatigue 08/17/2015   Last Assessment & Plan:  Relevant Hx: Course: Daily Update: Today's Plan:she still works about 4 day weeks but there is a lot of stress at work and that is tiring to her she is trying to rest more but still has some ease to tire  will see her labs  Electronically signed by: Mayer Camel, NP 09/16/15 [phone] Overview:  Last Assessment & Plan:  Relevant Hx: Course: Daily Update: Today's Pl  . Maternal blood transfusion    45 yrs. ago   . Mixed hyperlipidemia 08/17/2015   Last Assessment & Plan:  Relevant Hx: Course: Daily Update: Today's Plan:update her labs for her today  Electronically signed by: Mayer Camel, NP 09/16/15 [phone] Overview:  Last Assessment & Plan:  Relevant Hx: Course: Daily Update: Today's Plan:update her labs for her today  Electronically signed by: Mayer Camel, NP 09/16/15 [phone]  . Non-seasonal allergic rhinitis 08/17/2015   Last Assessment & Plan:  Relevant Hx: Course: Daily Update: Today's Plan:she has been having a difficult time with this and she is advised to use her flonase in the am and take zyrtec at [facility] and see if that helps control the drainage for her  Electronically signed by: Mayer Camel, NP 09/16/15 [phone]  . Phlebitis    during pregnancy - 45 yrs. ago  . PONV (postoperative nausea and vomiting)   . Primary osteoarthritis involving multiple joints 08/17/2015   Last Assessment & Plan:  Relevant Hx: Course: Daily Update: Today's Plan:she is better with her knees her hips do bother her some but she feels that is better since her surgery and increasing her exercise  Electronically signed by: Mayer Camel, NP 09/16/15 [phone] Overview:  Last Assessment & Plan:  Relevant Hx: Course: Daily Update: Today's Plan:she is better with her knees her hips d  . 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 Stress incontinence 08/17/2015   Last Assessment & Plan:  Relevant Hx: Course: Daily Update: Today's Plan:she feels this is stable for her at this time and will follow  Electronically signed by: Mayer Camel, NP 09/16/15 0950  . Type 2 diabetes mellitus without complications ([place]) [id number]   Last Assessment & Plan:  Relevant Hx: Course: Daily Update: Today's Plan:discussed her sugars with her she is doing better now that she is back at the [facility] and she is working on her exercise since her knee surgery, she did so well with. She is hopeful to keep losing weight. She is having stable sugars despite having bronchitis as well  Electronically signed by: Mayer Camel, NP 05/18  . Weakness 09/05/2017    Past Surgical History:  Procedure Laterality Date  . APPENDECTOMY    . CATARACT EXTRACTION    . CHOLECYSTECTOMY    . HERNIA REPAIR     umbilical   . JOINT REPLACEMENT Left 2009  . TOTAL KNEE  ARTHROPLASTY Right 08/10/2014   Procedure: TOTAL KNEE ARTHROPLASTY;  Surgeon: Vickey Huger, MD;  Location: [facility];  Service: Orthopedics;  Laterality: Right;    Current Medications: Current Meds  Medication Sig  . amLODipine (NORVASC) 5 MG tablet Take 5 mg by mouth daily before breakfast.   . aspirin 81 MG tablet Take 81 mg by mouth daily.  . fluticasone (FLONASE) 50 MCG/ACT nasal spray Place 1 spray into both nostrils daily as needed for allergies or rhinitis.  Marland Kitchen glimepiride (AMARYL) 2 MG tablet Take 2 mg by mouth daily with breakfast.  . LORazepam (ATIVAN) 0.5 MG tablet Take 0.5 mg by mouth 2 (two) times daily as needed for anxiety.  . Multiple Vitamins-Minerals (MULTIVITAMIN WITH MINERALS) tablet Take 1 tablet by mouth daily.  . NON FORMULARY Reflux away OTC  . Olopatadine HCl (PATADAY OP) Apply to eye as needed.  . pravastatin (  PRAVACHOL) 20 MG tablet Take 1 tablet (20 mg total) by mouth daily.  . [DISCONTINUED] Difluprednate (DUREZOL) 0.05 % EMUL Apply to eye.  . [DISCONTINUED] moxifloxacin (VIGAMOX) 0.5 % ophthalmic solution INSTILL 1 DROP INTO LEFT EYE QID  . [DISCONTINUED] omeprazole (PRILOSEC) 20 MG capsule Take 20 mg by mouth daily as needed (acid reflux).  . [DISCONTINUED] PROLENSA 0.07 % SOLN INT 1 GTT REY QPM     Allergies:   Omeprazole and Procaine   Social History   Socioeconomic History  . Marital status: Married    Spouse name: Not on file  . Number of children: Not on file  . Years of education: Not on file  . Highest education level: Not on file  Occupational History  . Not on file  Tobacco Use  . Smoking status: Never Smoker  . Smokeless tobacco: Never Used  Substance and Sexual Activity  . Alcohol use: Yes    Comment: "glass of wine once & a while"  . Drug use: No  . Sexual activity: Not on file  Other Topics Concern  . Not on file  Social History Narrative  . Not on file   Social Determinants of Health   Financial Resource Strain:   . Difficulty of  Paying Living Expenses: Not on file  Food Insecurity:   . Worried About Charity fundraiser in the Last Year: Not on file  . Ran Out of Food in the Last Year: Not on file  Transportation Needs:   . Lack of Transportation (Medical): Not on file  . Lack of Transportation (Non-Medical): Not on file  Physical Activity:   . Days of Exercise per Week: Not on file  . Minutes of Exercise per Session: Not on file  Stress:   . Feeling of Stress : Not on file  Social Connections:   . Frequency of Communication with Friends and Family: Not on file  . Frequency of Social Gatherings with Friends and Family: Not on file  . Attends Religious Services: Not on file  . Active Member of Clubs or Organizations: Not on file  . Attends Archivist Meetings: Not on file  . Marital Status: Not on file     Family History: The patient's family history includes Lung cancer in her mother and sister; Stomach cancer in her paternal grandmother; Stroke in her mother. ROS:   Please see the history of present illness.    All other systems reviewed and are negative.  EKGs/Labs/Other Studies Reviewed:    The following studies were reviewed today:  EKG:  EKG ordered today and personally reviewed.  The ekg ordered today demonstrates sinus rhythm nonspecific T wave abnormality   Physical Exam:    VS:  BP (!) 152/80   Pulse 74   Ht 5\' 3"  (1.6 m)   Wt 213 lb (96.6 kg)   SpO2 96%   BMI 37.73 kg/m     Wt Readings from Last 3 Encounters:  05/29/19 213 lb (96.6 kg)  09/19/17 204 lb 1.9 oz (92.6 kg)  07/31/14 231 lb 4.8 oz (104.9 kg)     GEN:  Well nourished, well developed in no acute distress HEENT: Normal NECK: No JVD; No carotid bruits LYMPHATICS: No lymphadenopathy CARDIAC: RRR, no murmurs, rubs, gallops RESPIRATORY:  Clear to auscultation without rales, wheezing or rhonchi  ABDOMEN: Soft, non-tender, non-distended MUSCULOSKELETAL:  No edema; No deformity  SKIN: Warm and dry NEUROLOGIC:   Alert and oriented x 3 PSYCHIATRIC:  Normal affect  Signed, Shirlee More, MD  05/29/2019 3:34 PM    [place]

## 2019-05-29 ENCOUNTER — Ambulatory Visit (INDEPENDENT_AMBULATORY_CARE_PROVIDER_SITE_OTHER): Payer: Medicare Other | Admitting: Cardiology

## 2019-05-29 ENCOUNTER — Encounter: Payer: Self-pay | Admitting: Cardiology

## 2019-05-29 ENCOUNTER — Other Ambulatory Visit: Payer: Self-pay

## 2019-05-29 VITALS — BP 152/80 | HR 74 | Ht 63.0 in | Wt 213.0 lb

## 2019-05-29 DIAGNOSIS — E782 Mixed hyperlipidemia: Secondary | ICD-10-CM | POA: Diagnosis not present

## 2019-05-29 DIAGNOSIS — R0789 Other chest pain: Secondary | ICD-10-CM

## 2019-05-29 DIAGNOSIS — I493 Ventricular premature depolarization: Secondary | ICD-10-CM | POA: Diagnosis not present

## 2019-05-29 DIAGNOSIS — I10 Essential (primary) hypertension: Secondary | ICD-10-CM | POA: Diagnosis not present

## 2019-05-29 DIAGNOSIS — R079 Chest pain, unspecified: Secondary | ICD-10-CM

## 2019-05-29 NOTE — Patient Instructions (Signed)
Medication Instructions:  Your physician recommends that you continue on your current medications as directed. Please refer to the Current Medication list given to you today.  *If you need a refill on your cardiac medications before your next appointment, please call your pharmacy*  Lab Work: None ordered  If you have labs (blood work) drawn today and your tests are completely normal, you will receive your results only by: Marland Kitchen MyChart Message (if you have MyChart) OR . A paper copy in the mail If you have any lab test that is abnormal or we need to change your treatment, we will call you to review the results.  Testing/Procedures: Your physician has requested that you have a lexiscan myoview. For further information please visit [email or web address]. Please follow instruction sheet, as given.  Follow-Up: At [facility], you and your health needs are our priority.  As part of our continuing mission to provide you with exceptional heart care, we have created designated Provider Care Teams.  These Care Teams include your primary Cardiologist (physician) and Advanced Practice Providers (APPs -  Physician Assistants and Nurse Practitioners) who all work together to provide you with the care you need, when you need it.  Your next appointment:   12 month(s)  The format for your next appointment:   In Person  Provider:   Shirlee More, MD  Other Instructions None

## 2019-06-03 ENCOUNTER — Telehealth: Payer: Self-pay | Admitting: Cardiology

## 2019-06-03 MED ORDER — METOPROLOL TARTRATE 25 MG PO TABS: 25.0000 mg | ORAL_TABLET | Freq: Two times a day (BID) | ORAL | 11 refills | Status: DC | PRN

## 2019-06-03 MED ORDER — METOPROLOL TARTRATE 25 MG PO TABS: 25.0000 mg | ORAL_TABLET | Freq: Two times a day (BID) | ORAL | 3 refills | Status: DC | PRN

## 2019-06-03 NOTE — Telephone Encounter (Signed)
By way I know the answer is to review the records in May and says she was taking lopressor 25 mg 2 times daily as needed.

## 2019-06-03 NOTE — Telephone Encounter (Signed)
Clarified order for metoprolol tartrate 25 mg one tablet by mouth BID PRN palpitations.  Sent to pharmacy as requested.  Advised Pt medication has been sent to her pharmacy

## 2019-06-03 NOTE — Telephone Encounter (Signed)
Metoprolol tartrate 25 MG 1 tablet by mouth as needed  Pt c/o medication issue:  1. Name of Medication: Metoprolol Tartrate 25 MG   2. How are you currently taking this medication (dosage and times per day)? Not currently taking medication   3. Are you having a reaction (difficulty breathing--STAT)? No  4. What is your medication issue? Patient is calling stating Dr. Bettina Gavia advised the patient to begin taking Metoprolol again at her last appointment on 05/29/19. She states when she reached out to the pharmacy they informed her the prescription was to old to be filled. Please advise.

## 2019-06-10 ENCOUNTER — Telehealth (HOSPITAL_COMMUNITY): Payer: Self-pay | Admitting: *Deleted

## 2019-06-10 NOTE — Telephone Encounter (Signed)
Patient given detailed instructions per Myocardial Perfusion Study Information Sheet for the test on 06/18/19 at 11:15. Patient notified to arrive 15 minutes early and that it is imperative to arrive on time for appointment to keep from having the test rescheduled.  If you need to cancel or reschedule your appointment, please call the office within 24 hours of your appointment. . Patient verbalized understanding.Joanne Mitchell

## 2019-06-18 ENCOUNTER — Ambulatory Visit (INDEPENDENT_AMBULATORY_CARE_PROVIDER_SITE_OTHER): Payer: Medicare Other

## 2019-06-18 ENCOUNTER — Other Ambulatory Visit: Payer: Self-pay

## 2019-06-18 DIAGNOSIS — R079 Chest pain, unspecified: Secondary | ICD-10-CM

## 2019-06-18 LAB — MYOCARDIAL PERFUSION IMAGING
LV dias vol: 100 mL (ref 46–106)
LV sys vol: 27 mL
Peak HR: 84 {beats}/min
Rest HR: 68 {beats}/min
SDS: 0
SRS: 0
SSS: 0
TID: 1.18

## 2019-06-18 MED ORDER — TECHNETIUM TC 99M TETROFOSMIN IV KIT
31.9000 | PACK | Freq: Once | INTRAVENOUS | Status: AC | PRN
  Administered 2019-06-18: 31.9 via INTRAVENOUS

## 2019-06-18 MED ORDER — REGADENOSON 0.4 MG/5ML IV SOLN
0.4000 mg | Freq: Once | INTRAVENOUS | Status: AC
  Administered 2019-06-18: 0.4 mg via INTRAVENOUS

## 2019-06-18 MED ORDER — TECHNETIUM TC 99M TETROFOSMIN IV KIT
10.3000 | PACK | Freq: Once | INTRAVENOUS | Status: AC | PRN
  Administered 2019-06-18: 10.3 via INTRAVENOUS

## 2020-02-24 DIAGNOSIS — N950 Postmenopausal bleeding: Secondary | ICD-10-CM | POA: Insufficient documentation

## 2020-02-24 HISTORY — DX: Postmenopausal bleeding: N95.0

## 2020-05-01 HISTORY — PX: ABDOMINAL HYSTERECTOMY: SHX81

## 2020-05-21 ENCOUNTER — Telehealth: Payer: Self-pay | Admitting: Cardiology

## 2020-05-21 ENCOUNTER — Ambulatory Visit: Payer: Medicare Other | Admitting: Cardiology

## 2020-05-21 ENCOUNTER — Other Ambulatory Visit: Payer: Self-pay

## 2020-05-21 VITALS — BP 130/68 | HR 85 | Ht 63.0 in | Wt 210.0 lb

## 2020-05-21 DIAGNOSIS — E119 Type 2 diabetes mellitus without complications: Secondary | ICD-10-CM | POA: Insufficient documentation

## 2020-05-21 DIAGNOSIS — R0602 Shortness of breath: Secondary | ICD-10-CM | POA: Diagnosis not present

## 2020-05-21 DIAGNOSIS — I809 Phlebitis and thrombophlebitis of unspecified site: Secondary | ICD-10-CM | POA: Insufficient documentation

## 2020-05-21 DIAGNOSIS — O99891 Other specified diseases and conditions complicating pregnancy: Secondary | ICD-10-CM | POA: Insufficient documentation

## 2020-05-21 DIAGNOSIS — I10 Essential (primary) hypertension: Secondary | ICD-10-CM | POA: Insufficient documentation

## 2020-05-21 DIAGNOSIS — Z9889 Other specified postprocedural states: Secondary | ICD-10-CM | POA: Insufficient documentation

## 2020-05-21 DIAGNOSIS — M1990 Unspecified osteoarthritis, unspecified site: Secondary | ICD-10-CM | POA: Insufficient documentation

## 2020-05-21 DIAGNOSIS — Z9289 Personal history of other medical treatment: Secondary | ICD-10-CM | POA: Insufficient documentation

## 2020-05-21 DIAGNOSIS — K219 Gastro-esophageal reflux disease without esophagitis: Secondary | ICD-10-CM | POA: Insufficient documentation

## 2020-05-21 DIAGNOSIS — T8859XA Other complications of anesthesia, initial encounter: Secondary | ICD-10-CM | POA: Insufficient documentation

## 2020-05-21 NOTE — Patient Instructions (Addendum)
Medication Instructions:  Your physician recommends that you continue on your current medications as directed. Please refer to the Current Medication list given to you today.  *If you need a refill on your cardiac medications before your next appointment, please call your pharmacy*   Lab Work: None If you have labs (blood work) drawn today and your tests are completely normal, you will receive your results only by: Marland Kitchen MyChart Message (if you have MyChart) OR . A paper copy in the mail If you have any lab test that is abnormal or we need to change your treatment, we will call you to review the results.   Testing/Procedures: None   Follow-Up: At [facility], you and your health needs are our priority.  As part of our continuing mission to provide you with exceptional heart care, we have created designated Provider Care Teams.  These Care Teams include your primary Cardiologist (physician) and Advanced Practice Providers (APPs -  Physician Assistants and Nurse Practitioners) who all work together to provide you with the care you need, when you need it.  We recommend signing up for the patient portal called "MyChart".  Sign up information is provided on this After Visit Summary.  MyChart is used to connect with patients for Virtual Visits (Telemedicine).  Patients are able to view lab/test results, encounter notes, upcoming appointments, etc.  Non-urgent messages can be sent to your provider as well.   To learn more about what you can do with MyChart, go to [email or web address].    Your next appointment:   As recommended by the hospital.   The format for your next appointment:   In Person  Provider:   Shirlee More, MD   Other Instructions

## 2020-05-21 NOTE — Telephone Encounter (Signed)
Pt has an appt today at 1:00.

## 2020-05-21 NOTE — Progress Notes (Signed)
Cardiology Office Note:    Date:  05/21/2020   ID:  Joanne Mitchell, DOB Mar 16, 1946, MRN [id number]  PCP:  Raina Mina., MD  Cardiologist:  Shirlee More, MD    Referring MD: Raina Mina., MD    ASSESSMENT:    1. Shortness of breath    PLAN:    In order of problems listed above:  1. She presents predominantly complains of unexplained severe shortness of breath at rest atypical chest pain in the setting of surgery general anesthesia endometrial cancer.  She will be quickly transitioned to the emergency room for evaluation for pulmonary thromboembolism also check troponin proBNP levels.   Next appointment: As needed after evaluation   Medication Adjustments/Labs and Tests Ordered: Current medicines are reviewed at length with the patient today.  Concerns regarding medicines are outlined above.  No orders of the defined types were placed in this encounter.  No orders of the defined types were placed in this encounter.  Chief complaint: I was severely short of breath  History of Present Illness:    Joanne Mitchell is a 75 y.o. female with a hx of Dyslipidemia, HTN, type 2 diabetes mellitus, palpitations and PVC's last seen 05/29/2019.  Review of epic Care Everywhere shows that when I saw her as part of [facility] healthcare in 2018 that Holter monitor showed frequent unifocal PVCs and a stress echo test was normal and no evidence of ischemia.  Compliance with diet, lifestyle and medications:  yes  Myoview 06/18/2019: Study Highlights  The left ventricular ejection fraction is hyperdynamic (>65%).  Nuclear stress EF: 73%.  There was no ST segment deviation noted during stress.  This is a low risk study.  No ischemia or scar noted.  Normal EF  Recent labs [facility] PCP 05/13/2019: CMP potassium 4.5 creatinine 0.51 GFR greater than 90 cc and normal liver function test. 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  She relates a background history of phlebitis after the birth of her son 61 years ago.  Last Wednesday she had a severe episode of shortness of breath with pressure in the chest up to her jaw made her very apprehensive occurred at rest lasted about 45 minutes and resolved since then she has felt quite weak.  Last night she states that she had an episode much more severe much more prolonged and really was very breathless thought of coming to the hospital but did not and today she feels badly and weak and apprehensive and call my office to triage and she was brought in.  Her EKG at baseline shows T wave inversion V1 V2 is a little bit more prominent on this EKG but similar to her previous she has no known CAD she had a myocardial perfusion study less than a year ago that was normal.  My concern is with her unexplained shortness of breath surgery general anesthesia and endometrial cancer she is at high risk for pulmonary embolism I told the patient and her family is most appropriate for directly the emergency room at [place] phone Dr. Colin Rhein in the ER he is aware of her we will send records to facilitate her care and she needs to be quickly evaluated for pulmonary thromboembolism and cardiac ischemia and I would include troponins and proBNP and her testing. Past Medical History:  Diagnosis Date  . Anxiety    uses Ativan 4-5x's per month  . Arthritis    knee- R  . Complication of anesthesia    novacaine /w epinephrine caused her heart to race, she remarks about post op, she had N&V  . Diabetes mellitus without complication (HCC)    TYPE 2  . Dyspepsia 05/04/2016  . Essential hypertension 08/17/2015   Last Assessment & Plan:  TRelevant Hx: Course: Daily Update: Today's Plan:this is stable for her at this time and she is taking her meds as directed and will  continue to follow for her see her CMP today  Electronically signed by: Krystal Clark, NP 09/16/15 [phone]  . 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 Gastroesophageal reflux disease with esophagitis 05/04/2017  . Generalized abdominal pain 05/04/2016  . GERD (gastroesophageal reflux disease)   . History of stress test    echo /w stress test, on treadmill , pt. 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 Dulce Sellar   . Hypertension   . Malaise and fatigue 08/17/2015   Last Assessment & Plan:  Relevant Hx: Course: Daily Update: Today's Plan:she still works about 4 day weeks but there is a lot of stress at work and that is tiring to her she is trying to rest more but still has some ease to tire will see her labs  Electronically signed by: Krystal Clark, NP 09/16/15 [phone] Overview:  Last Assessment & Plan:  Relevant Hx: Course: Daily Update: Today's Pl  . Maternal blood transfusion    45 yrs. ago   . Mixed hyperlipidemia 08/17/2015   Last Assessment & Plan:  Relevant Hx: Course: Daily Update: Today's Plan:update her labs for her today  Electronically signed by: Krystal Clark, NP 09/16/15 [phone] Overview:  Last Assessment & Plan:  Relevant Hx: Course: Daily Update: Today's Plan:update her labs for her today  Electronically signed by: Krystal Clark, NP 09/16/15 [phone]  . Non-seasonal allergic rhinitis 08/17/2015   Last Assessment & Plan:  Relevant Hx: Course: Daily Update: Today's Plan:she has been having a difficult time with this and she is advised to use her flonase in the am and take z[facility]tec at [facility] and see if that helps control the drainage for her  Electronically signed by: Krystal Clark, NP 09/16/15 [phone]  . Phlebitis    during pregnancy - 45 yrs. ago  . PONV (postoperative nausea and vomiting)   . Primary osteoarthritis involving multiple joints 08/17/2015   Last Assessment & Plan:  Relevant Hx: Course: Daily Update: Today's Plan:she is better with her  knees her hips do bother her some but she feels that is better since her surgery and increasing her exercise  Electronically signed by: Krystal Clark, NP 09/16/15 [phone] Overview:  Last Assessment & Plan:  Relevant Hx: Course: Daily Update: Today's Plan:she is better with her knees her hips d  . 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 Stress incontinence 08/17/2015   Last Assessment & Plan:  Relevant Hx: Course: Daily Update: Today's Plan:she feels this is stable for her at this time and will follow  Electronically signed by: Krystal Clark, NP 09/16/15 0950  . Type 2 diabetes mellitus without complications (HCC) 08/17/2015   Last Assessment & Plan:  Relevant Hx: Course: Daily Update: Today's Plan:discussed her sugars with her she is doing better now that she is back at the [facility] and she is working on her exercise since her knee surgery, she did so  well with. She is hopeful to keep losing weight. She is having stable sugars despite having bronchitis as well  Electronically signed by: Mayer Camel, NP 05/18  . Vertigo 09/19/2017  . Weakness 09/05/2017    Past Surgical History:  Procedure Laterality Date  . APPENDECTOMY    . CATARACT EXTRACTION    . CHOLECYSTECTOMY    . HERNIA REPAIR     umbilical   . JOINT REPLACEMENT Left 2009  . TOTAL KNEE ARTHROPLASTY Right 08/10/2014   Procedure: TOTAL KNEE ARTHROPLASTY;  Surgeon: Vickey Huger, MD;  Location: [place];  Service: Orthopedics;  Laterality: Right;    Current Medications: Current Meds  Medication Sig  . amLODipine (NORVASC) 5 MG tablet Take 5 mg by mouth daily before breakfast.   . aspirin 81 MG tablet Take 81 mg by mouth daily.  . Cholecalciferol 50 MCG (2000 UT) CAPS Take 2,000 Units by mouth daily.  . fluticasone (FLONASE) 50 MCG/ACT nasal spray Place 1 spray into both nostrils daily as needed for allergies or rhinitis.  Marland Kitchen glimepiride (AMARYL) 2 MG tablet Take 2 mg by mouth daily with breakfast.  .  loratadine (CLARITIN) 10 MG tablet Take 10 mg by mouth daily.  Marland Kitchen LORazepam (ATIVAN) 0.5 MG tablet Take 0.5 mg by mouth 2 (two) times daily as needed for anxiety.  . Meclizine HCl 25 MG CHEW Chew 25 mg by mouth 3 (three) times daily as needed.  . Multiple Vitamins-Minerals (MULTIVITAMIN WITH MINERALS) tablet Take 1 tablet by mouth daily.  . Olopatadine HCl (PATADAY OP) Apply to eye as needed.  Marland Kitchen omeprazole (PRILOSEC) 40 MG capsule Take 40 mg by mouth daily.  . pravastatin (PRAVACHOL) 20 MG tablet Take 1 tablet (20 mg total) by mouth daily.     Allergies:   Procaine   Social History   Socioeconomic History  . Marital status: Married    Spouse name: Not on file  . Number of children: Not on file  . Years of education: Not on file  . Highest education level: Not on file  Occupational History  . Not on file  Tobacco Use  . Smoking status: Never Smoker  . Smokeless tobacco: Never Used  Vaping Use  . Vaping Use: Never used  Substance and Sexual Activity  . Alcohol use: Yes    Comment: "glass of wine once & a while"  . Drug use: No  . Sexual activity: Not on file  Other Topics Concern  . Not on file  Social History Narrative  . Not on file   Social Determinants of Health   Financial Resource Strain: Not on file  Food Insecurity: Not on file  Transportation Needs: Not on file  Physical Activity: Not on file  Stress: Not on file  Social Connections: Not on file     Family History: The patient's family history includes Lung cancer in her mother and sister; Stomach cancer in her paternal grandmother; Stroke in her mother. ROS:   Please see the history of present illness.    All other systems reviewed and are negative.  EKGs/Labs/Other Studies Reviewed:    The following studies were reviewed today:  EKG:  EKG ordered today and personally reviewed.  The ekg ordered today demonstrates sinus rhythm T wave inversion V1 to V3 similar to her previous EKGs  Recent Labs: No  results found for requested labs within last 8760 hours.  Recent Lipid Panel No results found for: CHOL, TRIG, HDL, CHOLHDL, VLDL, LDLCALC, LDLDIRECT  Physical Exam:  VS:  BP 130/68   Pulse 85   Ht 5\' 3"  (1.6 m)   Wt 210 lb (95.3 kg)   SpO2 94%   BMI 37.20 kg/m     Wt Readings from Last 3 Encounters:  05/21/20 210 lb (95.3 kg)  06/18/19 211 lb (95.7 kg)  05/29/19 213 lb (96.6 kg)     GEN: She is anxious but not in distress well nourished, well developed in no acute distress HEENT: Normal NECK: No JVD; No carotid bruits LYMPHATICS: No lymphadenopathy CARDIAC: RRR, no murmurs, rubs, gallops RESPIRATORY:  Clear to auscultation without rales, wheezing or rhonchi  ABDOMEN: Soft, non-tender, non-distended MUSCULOSKELETAL:  No edema; No deformity  SKIN: Warm and dry NEUROLOGIC:  Alert and oriented x 3 PSYCHIATRIC:  Normal affect    Signed, 05/31/19, MD  05/21/2020 1:33 PM    [facility]

## 2020-05-21 NOTE — Telephone Encounter (Signed)
Message sent to Dr. Bettina Gavia for further instructions.

## 2020-05-21 NOTE — Telephone Encounter (Signed)
New Message:     Pt would like to be seen today. Joanne Mitchell had an episode last night where she had jaw pressure, weakness and lightheaded. She became nervous when this happen and seem like her heart started skipping.aa

## 2020-05-25 ENCOUNTER — Telehealth: Payer: Self-pay | Admitting: Cardiology

## 2020-05-25 ENCOUNTER — Ambulatory Visit: Payer: Medicare Other | Admitting: Cardiology

## 2020-05-25 ENCOUNTER — Encounter: Payer: Self-pay | Admitting: Cardiology

## 2020-05-25 ENCOUNTER — Other Ambulatory Visit: Payer: Self-pay

## 2020-05-25 VITALS — BP 130/68 | HR 84 | Ht 63.0 in | Wt 211.0 lb

## 2020-05-25 DIAGNOSIS — R079 Chest pain, unspecified: Secondary | ICD-10-CM | POA: Diagnosis not present

## 2020-05-25 DIAGNOSIS — C541 Malignant neoplasm of endometrium: Secondary | ICD-10-CM

## 2020-05-25 DIAGNOSIS — I2510 Atherosclerotic heart disease of native coronary artery without angina pectoris: Secondary | ICD-10-CM

## 2020-05-25 DIAGNOSIS — Z8542 Personal history of malignant neoplasm of other parts of uterus: Secondary | ICD-10-CM

## 2020-05-25 DIAGNOSIS — I10 Essential (primary) hypertension: Secondary | ICD-10-CM | POA: Diagnosis not present

## 2020-05-25 HISTORY — DX: Personal history of malignant neoplasm of other parts of uterus: Z85.42

## 2020-05-25 HISTORY — DX: Malignant neoplasm of endometrium: C54.1

## 2020-05-25 NOTE — Telephone Encounter (Signed)
Patient is calling requesting to speak to Dr. Joya Gaskins nurse about her mothers appointment that she had with him last week. Please advise.

## 2020-05-25 NOTE — Progress Notes (Addendum)
Cardiology Office Note:    Date:  05/25/2020   ID:  Joanne Mitchell, DOB 21-Apr-1946, MRN [id number]  PCP:  Raina Mina., MD  Cardiologist:  Shirlee More, MD    Referring MD: Raina Mina., MD    ASSESSMENT:    1. Chest pain of uncertain etiology   2. Coronary artery calcification seen on CT scan   3. Essential hypertension    PLAN:    In order of problems listed above:  1. With impending surgery I would like her to have an ischemia evaluation we will attempt to set up a cardiac CTA and if not able to do by 8 February pill Myoview my office.  I suspect he will be normal and I think that anxiety is playing a role in the symptoms prompting her office visit.  She does have coronary calcification on CT scan this is not uncommon in her age group and is not the equivalent of CAD. 2. Stable hypertension BP at target continue current treatment calcium channel blocker beta-blocker 3. Stable dyslipidemia continue statin  06/04/2020: Cardiac CTA shows minimal CAD proceed with her planned surgery. Next appointment: 3 months   Medication Adjustments/Labs and Tests Ordered: Current medicines are reviewed at length with the patient today.  Concerns regarding medicines are outlined above.  Orders Placed This Encounter  Procedures  . CT CORONARY MORPH W/CTA COR W/SCORE W/CA W/CM &/OR WO/CM  . CT CORONARY FRACTIONAL FLOW RESERVE DATA PREP  . CT CORONARY FRACTIONAL FLOW RESERVE FLUID ANALYSIS  . Basic metabolic panel   No orders of the defined types were placed in this encounter.   Chief Complaint  Patient presents with  . Follow-up    Recent office and ED visit shortness of breath and chest pain, evaluation showed no evidence of pulmonary embolism.    History of Present Illness:    Joanne Mitchell is a 75 y.o. female with a hx of dyslipidemia hypertension type 2 diabetes and symptomatic PVCs. She was last seen 05/12/2020 with progressive shortness of breath and jaw discomfort  with a new diagnosis of endometrial cancer and recent D&C. I was quite concerned she had pulmonary embolism and sent her directly to the emergency room to undergo a stat CTA chest she was stable hemodynamically pulse 89 blood pressure 181/86 oxygen saturation 97%. Laboratory studies showed normal CBC hemoglobin 15.8 creatinine 0.60. EKG did not show ischemic changes portable chest x-ray had no acute findings no indication of pneumonia or heart failure and CTA was performed with no findings of pulmonary embolism and she was described as having minimal coronary artery calcification small pulmonary nodules. Does not appear the troponin was assessed or proBNP level.  She did undergo an ischemia evaluation 06/17/1998 45-month ejection fraction 73% normal perfusion and function. Her EKG in my office showed chronic T wave inversions in the anterior septal leads..  Compliance with diet, lifestyle and medications: Yes  I obtain the ED records from [facility] and completeness she had 2 undetectable troponins and a normal proBNP level less than 100. She is quite anxious about her pending surgery She has had no further chest pain but just finds her self breathless at times no exertional pattern not severe in nature. She is seen by GYN oncology and is scheduled hysterectomy 06/11/2020 Past Medical History:  Diagnosis Date  . Anxiety    uses Ativan 4-5x's per month  . Arthritis    knee- R  . Complication of anesthesia    novacaine /w  epinephrine caused her heart to race, she remarks about post op, she had N&V  . Diabetes mellitus without complication ([place])    TYPE 2  . Dyspepsia 05/04/2016  . Essential hypertension 08/17/2015   Last Assessment & Plan:  TRelevant Hx: Course: Daily Update: Today's Plan:this is stable for her at this time and she is taking her meds as directed and will continue to follow for her see her CMP today  Electronically signed by: Mayer Camel, NP 09/16/15 [phone]  .  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 Gastroesophageal reflux disease with esophagitis 05/04/2017  . Generalized abdominal pain 05/04/2016  . GERD (gastroesophageal reflux disease)   . History of stress test    echo /w stress test, on treadmill , pt. 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 Bettina Gavia   . Hypertension   . Malaise and fatigue 08/17/2015   Last Assessment & Plan:  Relevant Hx: Course: Daily Update: Today's Plan:she still works about 4 day weeks but there is a lot of stress at work and that is tiring to her she is trying to rest more but still has some ease to tire will see her labs  Electronically signed by: Mayer Camel, NP 09/16/15 [phone] Overview:  Last Assessment & Plan:  Relevant Hx: Course: Daily Update: Today's Pl  . Maternal blood transfusion    45 yrs. ago   . Mixed hyperlipidemia 08/17/2015   Last Assessment & Plan:  Relevant Hx: Course: Daily Update: Today's Plan:update her labs for her today  Electronically signed by: Mayer Camel, NP 09/16/15 [phone] Overview:  Last Assessment & Plan:  Relevant Hx: Course: Daily Update: Today's Plan:update her labs for her today  Electronically signed by: Mayer Camel, NP 09/16/15 [phone]  . Non-seasonal allergic rhinitis 08/17/2015   Last Assessment & Plan:  Relevant Hx: Course: Daily Update: Today's Plan:she has been having a difficult time with this and she is advised to use her flonase in the am and take zyrtec at [facility] and see if that helps control the drainage for her  Electronically signed by: Mayer Camel, NP 09/16/15 [phone]  . Phlebitis    during pregnancy - 45 yrs. ago  . PONV (postoperative nausea and vomiting)   . Primary osteoarthritis involving multiple joints 08/17/2015   Last Assessment & Plan:  Relevant Hx: Course: Daily Update: Today's Plan:she is better with her knees her hips do bother her some but she feels that is better since her surgery and increasing her exercise   Electronically signed by: Mayer Camel, NP 09/16/15 [phone] Overview:  Last Assessment & Plan:  Relevant Hx: Course: Daily Update: Today's Plan:she is better with her knees her hips d  . 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 Stress incontinence 08/17/2015   Last Assessment & Plan:  Relevant Hx: Course: Daily Update: Today's Plan:she feels this is stable for her at this time and will follow  Electronically signed by: Mayer Camel, NP 09/16/15 0950  . Type 2 diabetes mellitus without complications ([place]) 9/37/3428   Last Assessment & Plan:  Relevant Hx: Course: Daily Update: Today's Plan:discussed her sugars with her she is doing better now that she is back at the [facility] and she is working on her exercise since her knee surgery, she did so well with. She is hopeful to keep losing weight. She is having stable sugars despite having bronchitis as well  Electronically signed by: Mayer Camel, NP 05/18  . Vertigo  09/19/2017  . Weakness 09/05/2017    Past Surgical History:  Procedure Laterality Date  . APPENDECTOMY    . CATARACT EXTRACTION    . CHOLECYSTECTOMY    . HERNIA REPAIR     umbilical   . JOINT REPLACEMENT Left 2009  . TOTAL KNEE ARTHROPLASTY Right 08/10/2014   Procedure: TOTAL KNEE ARTHROPLASTY;  Surgeon: Vickey Huger, MD;  Location: [place];  Service: Orthopedics;  Laterality: Right;    Current Medications: Current Meds  Medication Sig  . amLODipine (NORVASC) 10 MG tablet Take 10 mg by mouth daily.  Marland Kitchen aspirin 81 MG tablet Take 81 mg by mouth daily.  . Cholecalciferol 50 MCG (2000 UT) CAPS Take 2,000 Units by mouth daily.  . fluticasone (FLONASE) 50 MCG/ACT nasal spray Place 1 spray into both nostrils daily as needed for allergies or rhinitis.  Marland Kitchen glimepiride (AMARYL) 2 MG tablet Take 2 mg by mouth daily with breakfast.  . loratadine (CLARITIN) 10 MG tablet Take 10 mg by mouth daily.  Marland Kitchen LORazepam (ATIVAN) 0.5 MG tablet Take 0.5 mg by mouth 2 (two) times  daily as needed for anxiety.  . Meclizine HCl 25 MG CHEW Chew 25 mg by mouth 3 (three) times daily as needed.  . metoprolol tartrate (LOPRESSOR) 25 MG tablet Take 1 tablet (25 mg total) by mouth 2 (two) times daily as needed.  . Multiple Vitamins-Minerals (MULTIVITAMIN WITH MINERALS) tablet Take 1 tablet by mouth daily.  . Olopatadine HCl (PATADAY OP) Apply to eye as needed.  Marland Kitchen omeprazole (PRILOSEC) 40 MG capsule Take 40 mg by mouth daily.  . pravastatin (PRAVACHOL) 20 MG tablet Take 1 tablet (20 mg total) by mouth daily.     Allergies:   Procaine   Social History   Socioeconomic History  . Marital status: Married    Spouse name: Not on file  . Number of children: Not on file  . Years of education: Not on file  . Highest education level: Not on file  Occupational History  . Not on file  Tobacco Use  . Smoking status: Never Smoker  . Smokeless tobacco: Never Used  Vaping Use  . Vaping Use: Never used  Substance and Sexual Activity  . Alcohol use: Yes    Comment: "glass of wine once & a while"  . Drug use: No  . Sexual activity: Not on file  Other Topics Concern  . Not on file  Social History Narrative  . Not on file   Social Determinants of Health   Financial Resource Strain: Not on file  Food Insecurity: Not on file  Transportation Needs: Not on file  Physical Activity: Not on file  Stress: Not on file  Social Connections: Not on file     Family History: The patient's family history includes Lung cancer in her mother and sister; Stomach cancer in her paternal grandmother; Stroke in her mother. ROS:   Please see the history of present illness.    All other systems reviewed and are negative.  EKGs/Labs/Other Studies Reviewed:    The following studies were reviewed today:   Physical Exam:    VS:  BP 130/68   Pulse 84   Ht 5\' 3"  (1.6 m)   Wt 211 lb (95.7 kg)   SpO2 99%   BMI 37.38 kg/m     Wt Readings from Last 3 Encounters:  05/25/20 211 lb (95.7 kg)   05/21/20 210 lb (95.3 kg)  06/18/19 211 lb (95.7 kg)     GEN:  Well nourished, well developed in no acute distress HEENT: Normal NECK: No JVD; No carotid bruits LYMPHATICS: No lymphadenopathy CARDIAC: RRR, no murmurs, rubs, gallops RESPIRATORY:  Clear to auscultation without rales, wheezing or rhonchi  ABDOMEN: Soft, non-tender, non-distended MUSCULOSKELETAL:  No edema; No deformity  SKIN: Warm and dry NEUROLOGIC:  Alert and oriented x 3 PSYCHIATRIC:  Normal affect    Signed, Shirlee More, MD  05/25/2020 2:22 PM    [place]

## 2020-05-25 NOTE — Telephone Encounter (Signed)
Spoke to the patients daughter just now and she let me know that the patient was needing to see Dr. Bettina Gavia this week in order to follow up with him post her hospital visit.   I got her scheduled for today with Dr. Bettina Gavia here in [place].

## 2020-05-25 NOTE — Patient Instructions (Signed)
Medication Instructions:  Your physician recommends that you continue on your current medications as directed. Please refer to the Current Medication list given to you today.  *If you need a refill on your cardiac medications before your next appointment, please call your pharmacy*   Lab Work: Your physician recommends that you return for lab work in: TODAY BMP If you have labs (blood work) drawn today and your tests are completely normal, you will receive your results only by: Marland Kitchen MyChart Message (if you have MyChart) OR . A paper copy in the mail If you have any lab test that is abnormal or we need to change your treatment, we will call you to review the results.   Testing/Procedures: Your cardiac CT will be scheduled at the below location:   [facility] [address] [place],  [address] [phone]  If scheduled at [facility], please arrive at the [facility] main entrance of [facility] 30 minutes prior to test start time. Proceed to the [facility] Radiology Department (first floor) to check-in and test prep.   Please follow these instructions carefully (unless otherwise directed):   On the Night Before the Test: . Be sure to Drink plenty of water. . Do not consume any caffeinated/decaffeinated beverages or chocolate 12 hours prior to your test. . Do not take any antihistamines 12 hours prior to your test.  On the Day of the Test: . Drink plenty of water. Do not drink any water within one hour of the test. . Do not eat any food 4 hours prior to the test. . You may take your regular medications prior to the test.  . Take metoprolol (Lopressor) two hours prior to test. . FEMALES- please wear underwire-free bra if available       After the Test: . Drink plenty of water. . After receiving IV contrast, you may experience a mild flushed feeling. This is normal. . On occasion, you may experience a mild rash up to 24 hours after the  test. This is not dangerous. If this occurs, you can take Benadryl 25 mg and increase your fluid intake. . If you experience trouble breathing, this can be serious. If it is severe call 911 IMMEDIATELY. If it is mild, please call our office. . If you take any of these medications: Glipizide/Metformin, Avandament, Glucavance, please do not take 48 hours after completing test unless otherwise instructed.   Once we have confirmed authorization from your insurance company, we will call you to set up a date and time for your test. Based on how quickly your insurance processes prior authorizations requests, please allow up to 4 weeks to be contacted for scheduling your Cardiac CT appointment. Be advised that routine Cardiac CT appointments could be scheduled as many as 8 weeks after your provider has ordered it.  For non-scheduling related questions, please contact the cardiac imaging nurse navigator should you have any questions/concerns: Marchia Bond, Cardiac Imaging Nurse Navigator Burley Saver, Interim Cardiac Imaging Nurse [place] [facility] Services Direct Office Dial: [phone]   For scheduling needs, including cancellations and rescheduling, please call [place], [phone].     Follow-Up: At [facility], you and your health needs are our priority.  As part of our continuing mission to provide you with exceptional heart care, we have created designated Provider Care Teams.  These Care Teams include your primary Cardiologist (physician) and Advanced Practice Providers (APPs -  Physician Assistants and Nurse Practitioners) who all  work together to provide you with the care you need, when you need it.  We recommend signing up for the patient portal called "MyChart".  Sign up information is provided on this After Visit Summary.  MyChart is used to connect with patients for Virtual Visits (Telemedicine).  Patients are able to view lab/test results, encounter notes,  upcoming appointments, etc.  Non-urgent messages can be sent to your provider as well.   To learn more about what you can do with MyChart, go to [email or web address].    Your next appointment:   3 month(s)  The format for your next appointment:   In Person  Provider:   Shirlee More, MD   Other Instructions

## 2020-05-26 DIAGNOSIS — I700 Atherosclerosis of aorta: Secondary | ICD-10-CM

## 2020-05-26 HISTORY — DX: Atherosclerosis of aorta: I70.0

## 2020-05-31 ENCOUNTER — Ambulatory Visit: Payer: Medicare Other | Admitting: Cardiology

## 2020-06-01 ENCOUNTER — Telehealth (HOSPITAL_COMMUNITY): Payer: Self-pay | Admitting: *Deleted

## 2020-06-01 NOTE — Telephone Encounter (Signed)
Reaching out to patient to offer assistance regarding upcoming cardiac imaging study; pt verbalizes understanding of appt date/time, parking situation and where to check in, pre-test NPO status, and verified current allergies; name and call back number provided for further questions should they arise  Gordy Clement RN Navigator Cardiac [place] and Vascular [phone] office [phone] cell  Pt states her resting HR is in the mid to upper 60's. Pt advised to take one tablet of her home 25mg  metoprolol tartrate 2 hours prior to test.

## 2020-06-03 ENCOUNTER — Encounter (HOSPITAL_COMMUNITY): Payer: Self-pay

## 2020-06-03 ENCOUNTER — Ambulatory Visit (HOSPITAL_COMMUNITY)
Admission: RE | Admit: 2020-06-03 | Discharge: 2020-06-03 | Disposition: A | Discharge status: Home / Self Care | Payer: Medicare Other | Source: Ambulatory Visit | Attending: Cardiology | Admitting: Cardiology

## 2020-06-03 ENCOUNTER — Encounter: Payer: Self-pay | Admitting: *Deleted

## 2020-06-03 ENCOUNTER — Other Ambulatory Visit: Payer: Self-pay

## 2020-06-03 DIAGNOSIS — I517 Cardiomegaly: Secondary | ICD-10-CM | POA: Diagnosis not present

## 2020-06-03 DIAGNOSIS — Z006 Encounter for examination for normal comparison and control in clinical research program: Secondary | ICD-10-CM

## 2020-06-03 DIAGNOSIS — I700 Atherosclerosis of aorta: Secondary | ICD-10-CM | POA: Insufficient documentation

## 2020-06-03 DIAGNOSIS — I2510 Atherosclerotic heart disease of native coronary artery without angina pectoris: Secondary | ICD-10-CM | POA: Diagnosis not present

## 2020-06-03 DIAGNOSIS — R079 Chest pain, unspecified: Secondary | ICD-10-CM | POA: Insufficient documentation

## 2020-06-03 MED ORDER — NITROGLYCERIN 0.4 MG SL SUBL
0.8000 mg | SUBLINGUAL_TABLET | Freq: Once | SUBLINGUAL | Status: AC | PRN
  Administered 2020-06-03: 0.8 mg via SUBLINGUAL

## 2020-06-03 MED ORDER — NITROGLYCERIN 0.4 MG SL SUBL
SUBLINGUAL_TABLET | SUBLINGUAL | Status: AC
  Filled 2020-06-03: qty 2

## 2020-06-03 MED ORDER — IOHEXOL 350 MG/ML SOLN
80.0000 mL | Freq: Once | INTRAVENOUS | Status: AC | PRN
  Administered 2020-06-03: 80 mL via INTRAVENOUS

## 2020-06-03 NOTE — Research (Signed)
IDENTIFY Informed Consent                  Subject Name:Joanne Mitchell      Subject met inclusion and exclusion criteria.  The informed consent form, study requirements and expectations were reviewed with the subject and questions and concerns were addressed prior to the signing of the consent form.  The subject verbalized understanding of the trial requirements.  The subject agreed to participate in the IDENTIFY trial and signed the informed consent.  The informed consent was obtained prior to performance of any protocol-specific procedures for the subject.  A copy of the signed informed consent was given to the subject and a copy was placed in the subject's medical record.   Kenya Chalmers, Research Assistant  06/03/2020 08:25 a.m.  

## 2020-06-04 ENCOUNTER — Telehealth: Payer: Self-pay

## 2020-06-04 NOTE — Telephone Encounter (Signed)
-----   Message from Richardo Priest, MD sent at 06/04/2020  8:00 AM EST ----- I dictated an addendum to my note it should go to the surgeon  Good results on CTA minimal changes in the coronary arteries

## 2020-06-04 NOTE — Telephone Encounter (Signed)
Spoke with patient regarding results and recommendation.  Patient verbalizes understanding and is agreeable to plan of care. Advised patient to call back with any issues or concerns.  

## 2020-08-18 NOTE — Progress Notes (Signed)
Cardiology Office Note:    Date:  08/19/2020   ID:  Joanne Mitchell, DOB Sep 13, 1945, MRN [id number]  PCP:  Raina Mina., MD  Cardiologist:  Shirlee More, MD    Referring MD: Raina Mina., MD    ASSESSMENT:    1. Mild CAD   2. Essential hypertension   3. Mixed hyperlipidemia   4. Frequent PVCs    PLAN:    In order of problems listed above:  1. She has mild nonobstructive CAD she has had anginal discomfort in the past and will continue medical therapy including aspirin statin beta-blocker antihypertensive and nitroglycerin as needed.  She is committed to getting back to her exercise program once completely recovered from her GYN malignancy. 2. Stable blood pressure is at target continue treatment beta-blocker calcium channel blocker 3. Continue her statin she is due for follow-up labs with her PCP 4. Asymptomatic continue beta-blocker   Next appointment: 1 year   Medication Adjustments/Labs and Tests Ordered: Current medicines are reviewed at length with the patient today.  Concerns regarding medicines are outlined above.  No orders of the defined types were placed in this encounter.  Meds ordered this encounter  Medications  . nitroGLYCERIN (NITROSTAT) 0.4 MG SL tablet    Sig: Place 1 tablet (0.4 mg total) under the tongue every 5 (five) minutes as needed for chest pain.    Dispense:  25 tablet    Refill:  0    Chief Complaint  Patient presents with  . Follow-up    History of Present Illness:    Joanne Mitchell is a 75 y.o. female with a hx of dyslipidemia hypertension type 2 diabetes symptomatic PVCs last seen 05/25/2020 for chest pain with coronary artery calcification on CT scan after recent [facility] visit with chest pain and 2 normal troponin assays.  Compliance with diet, lifestyle and medications: Yes  She is doing well with her endometrial cancer has had TAH BSOO  overall and radiation therapy. On a statin no muscle pain or weakness No  chest pain shortness of breath palpitation or syncope I reviewed her cardiac CTA her coronary score is not high she has very minimal CAD and a small myocardial bridging segment which is a curiosity.  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 Past Medical History:  Diagnosis Date  . Anxiety    uses Ativan 4-5x's per month  . Arthritis    knee- R  . Complication of anesthesia    novacaine /w epinephrine caused her heart to race, she remarks about post op, she had N&V  . Diabetes mellitus without complication ([place])    TYPE 2  . Dyspepsia 05/04/2016  . Essential hypertension 08/17/2015   Last Assessment & Plan:  TRelevant Hx: Course: Daily Update: Today's Plan:this is stable for her at this time and she is taking her meds as directed and will continue to follow for her see her CMP today  Electronically signed by: Mayer Camel, NP 09/16/15 [phone]  . 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 Gastroesophageal reflux disease with esophagitis 05/04/2017  . Generalized abdominal pain 05/04/2016  . GERD (gastroesophageal reflux disease)   .  History of stress test    echo /w stress test, on treadmill , pt. 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 Bettina Gavia   . Hypertension   . Malaise and fatigue 08/17/2015   Last Assessment & Plan:  Relevant Hx: Course: Daily Update: Today's Plan:she still works about 4 day weeks but there is a lot of stress at work and that is tiring to her she is trying to rest more but still has some ease to tire will see her labs  Electronically signed by: Mayer Camel, NP 09/16/15 [phone] Overview:  Last  Assessment & Plan:  Relevant Hx: Course: Daily Update: Today's Pl  . Maternal blood transfusion    45 yrs. ago   . Mixed hyperlipidemia 08/17/2015   Last Assessment & Plan:  Relevant Hx: Course: Daily Update: Today's Plan:update her labs for her today  Electronically signed by: Mayer Camel, NP 09/16/15 [phone] Overview:  Last Assessment & Plan:  Relevant Hx: Course: Daily Update: Today's Plan:update her labs for her today  Electronically signed by: Mayer Camel, NP 09/16/15 [phone]  . Non-seasonal allergic rhinitis 08/17/2015   Last Assessment & Plan:  Relevant Hx: Course: Daily Update: Today's Plan:she has been having a difficult time with this and she is advised to use her flonase in the am and take zyrtec at [facility] and see if that helps control the drainage for her  Electronically signed by: Mayer Camel, NP 09/16/15 [phone]  . Phlebitis    during pregnancy - 45 yrs. ago  . PONV (postoperative nausea and vomiting)   . Primary osteoarthritis involving multiple joints 08/17/2015   Last Assessment & Plan:  Relevant Hx: Course: Daily Update: Today's Plan:she is better with her knees her hips do bother her some but she feels that is better since her surgery and increasing her exercise  Electronically signed by: Mayer Camel, NP 09/16/15 [phone] Overview:  Last Assessment & Plan:  Relevant Hx: Course: Daily Update: Today's Plan:she is better with her knees her hips d  . 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 Stress incontinence 08/17/2015   Last Assessment & Plan:  Relevant Hx: Course: Daily Update: Today's Plan:she feels this is stable for her at this time and will follow  Electronically signed by: Mayer Camel, NP 09/16/15 0950  . Type 2 diabetes mellitus without complications ([place]) 08/23/9561   Last Assessment & Plan:  Relevant Hx: Course: Daily Update: Today's Plan:discussed her sugars with her she is doing better now that she is back at the [facility]  and she is working on her exercise since her knee surgery, she did so well with. She is hopeful to keep losing weight. She is having stable sugars despite having bronchitis as well  Electronically signed by: Mayer Camel, NP 05/18  . Vertigo 09/19/2017  . Weakness 09/05/2017    Past Surgical History:  Procedure Laterality Date  . APPENDECTOMY    . CATARACT EXTRACTION    . CHOLECYSTECTOMY    . HERNIA REPAIR     umbilical   . JOINT REPLACEMENT Left 2009  . TOTAL KNEE ARTHROPLASTY Right 08/10/2014   Procedure: TOTAL KNEE ARTHROPLASTY;  Surgeon: Vickey Huger, MD;  Location: [facility];  Service: Orthopedics;  Laterality: Right;    Current Medications: Current Meds  Medication Sig  . acetaminophen (TYLENOL) 325 MG tablet Take 2 tablets by mouth as needed for pain.  Marland Kitchen amLODipine (NORVASC) 5 MG tablet Take 1 tablet by mouth  daily.  . aspirin 81 MG tablet Take 81 mg by mouth daily.  . Cholecalciferol 50 MCG (2000 UT) CAPS Take 2,000 Units by mouth daily.  . fluticasone (FLONASE) 50 MCG/ACT nasal spray Place 1 spray into both nostrils daily as needed for allergies or rhinitis.  Marland Kitchen glimepiride (AMARYL) 2 MG tablet Take 2 mg by mouth daily with breakfast.  . loratadine (CLARITIN) 10 MG tablet Take 10 mg by mouth daily.  Marland Kitchen LORazepam (ATIVAN) 0.5 MG tablet Take 0.5 mg by mouth 2 (two) times daily as needed for anxiety.  . Meclizine HCl 25 MG CHEW Chew 25 mg by mouth 3 (three) times daily as needed.  . metoprolol tartrate (LOPRESSOR) 25 MG tablet Take 1 tablet (25 mg total) by mouth 2 (two) times daily as needed.  . Multiple Vitamins-Minerals (MULTIVITAMIN WITH MINERALS) tablet Take 1 tablet by mouth daily.  . nitroGLYCERIN (NITROSTAT) 0.4 MG SL tablet Place 1 tablet (0.4 mg total) under the tongue every 5 (five) minutes as needed for chest pain.  Marland Kitchen Olopatadine HCl (PATADAY OP) Apply to eye as needed for allergies.  Marland Kitchen omeprazole (PRILOSEC) 40 MG capsule Take 40 mg by mouth daily.  .  pravastatin (PRAVACHOL) 20 MG tablet Take 1 tablet (20 mg total) by mouth daily.     Allergies:   Procaine   Social History   Socioeconomic History  . Marital status: Married    Spouse name: Not on file  . Number of children: Not on file  . Years of education: Not on file  . Highest education level: Not on file  Occupational History  . Not on file  Tobacco Use  . Smoking status: Never Smoker  . Smokeless tobacco: Never Used  Vaping Use  . Vaping Use: Never used  Substance and Sexual Activity  . Alcohol use: Yes    Comment: "glass of wine once & a while"  . Drug use: No  . Sexual activity: Not on file  Other Topics Concern  . Not on file  Social History Narrative  . Not on file   Social Determinants of Health   Financial Resource Strain: Not on file  Food Insecurity: Not on file  Transportation Needs: Not on file  Physical Activity: Not on file  Stress: Not on file  Social Connections: Not on file     Family History: The patient's family history includes Lung cancer in her mother and sister; Stomach cancer in her paternal grandmother; Stroke in her mother. ROS:   Please see the history of present illness.    All other systems reviewed and are negative.  EKGs/Labs/Other Studies Reviewed:    The following studies were reviewed today:    Recent Labs: 05/26/2020: Cholesterol 147 LDL 102 HDL 41 CMP creatinine 0.64 GFR greater than 90 cc and normal liver function test   Physical Exam:    VS:  BP 120/70 (BP Location: Right Arm, Patient Position: Sitting, Cuff Size: Large)   Pulse 76   Ht 5\' 3"  (1.6 m)   Wt 210 lb (95.3 kg)   SpO2 96%   BMI 37.20 kg/m     Wt Readings from Last 3 Encounters:  08/19/20 210 lb (95.3 kg)  05/25/20 211 lb (95.7 kg)  05/21/20 210 lb (95.3 kg)     GEN:  Well nourished, well developed in no acute distress HEENT: Normal NECK: No JVD; No carotid bruits LYMPHATICS: No lymphadenopathy CARDIAC: RRR, no murmurs, rubs,  gallops RESPIRATORY:  Clear to auscultation without rales, wheezing  or rhonchi  ABDOMEN: Soft, non-tender, non-distended MUSCULOSKELETAL:  No edema; No deformity  SKIN: Warm and dry NEUROLOGIC:  Alert and oriented x 3 PSYCHIATRIC:  Normal affect    Signed, Shirlee More, MD  08/19/2020 2:27 PM    [place]

## 2020-08-19 ENCOUNTER — Encounter: Payer: Self-pay | Admitting: Cardiology

## 2020-08-19 ENCOUNTER — Other Ambulatory Visit: Payer: Self-pay

## 2020-08-19 ENCOUNTER — Ambulatory Visit: Payer: Medicare Other | Admitting: Cardiology

## 2020-08-19 VITALS — BP 120/70 | HR 76 | Ht 63.0 in | Wt 210.0 lb

## 2020-08-19 DIAGNOSIS — E782 Mixed hyperlipidemia: Secondary | ICD-10-CM

## 2020-08-19 DIAGNOSIS — I493 Ventricular premature depolarization: Secondary | ICD-10-CM

## 2020-08-19 DIAGNOSIS — I2510 Atherosclerotic heart disease of native coronary artery without angina pectoris: Secondary | ICD-10-CM | POA: Diagnosis not present

## 2020-08-19 DIAGNOSIS — I10 Essential (primary) hypertension: Secondary | ICD-10-CM

## 2020-08-19 MED ORDER — NITROGLYCERIN 0.4 MG SL SUBL: 0.4000 mg | SUBLINGUAL_TABLET | SUBLINGUAL | 0 refills | Status: AC | PRN

## 2020-08-19 NOTE — Patient Instructions (Signed)
Medication Instructions:  Your physician has recommended you make the following change in your medication:  START: Nitroglycerin 0.4 mg take one tablet by mouth every 5 minutes up to three times as needed for chest pain.  *If you need a refill on your cardiac medications before your next appointment, please call your pharmacy*   Lab Work: None If you have labs (blood work) drawn today and your tests are completely normal, you will receive your results only by: Marland Kitchen MyChart Message (if you have MyChart) OR . A paper copy in the mail If you have any lab test that is abnormal or we need to change your treatment, we will call you to review the results.   Testing/Procedures: None   Follow-Up: At [facility], you and your health needs are our priority.  As part of our continuing mission to provide you with exceptional heart care, we have created designated Provider Care Teams.  These Care Teams include your primary Cardiologist (physician) and Advanced Practice Providers (APPs -  Physician Assistants and Nurse Practitioners) who all work together to provide you with the care you need, when you need it.  We recommend signing up for the patient portal called "MyChart".  Sign up information is provided on this After Visit Summary.  MyChart is used to connect with patients for Virtual Visits (Telemedicine).  Patients are able to view lab/test results, encounter notes, upcoming appointments, etc.  Non-urgent messages can be sent to your provider as well.   To learn more about what you can do with MyChart, go to [email or web address].    Your next appointment:   1 year(s)  The format for your next appointment:   In Person  Provider:   Shirlee More, MD   Other Instructions

## 2020-09-14 ENCOUNTER — Telehealth: Payer: Self-pay

## 2020-09-14 DIAGNOSIS — Z006 Encounter for examination for normal comparison and control in clinical research program: Secondary | ICD-10-CM

## 2020-09-14 NOTE — Telephone Encounter (Signed)
Called patient for 90 day Identify phone call pt stated she is still having some cardiac symptoms but did follow up with PCP, I reminded the patient that we would be calling her back around February for a year follow up phone call.

## 2021-05-12 ENCOUNTER — Telehealth: Payer: Self-pay | Admitting: Cardiology

## 2021-05-12 NOTE — Telephone Encounter (Signed)
PCP calling to speak w/ DOD in regards to this pts abnormal ekg

## 2021-05-19 NOTE — Progress Notes (Signed)
Cardiology Office Note:    Date:  05/20/2021   ID:  Joanne Mitchell, DOB 02-12-1946, MRN [id number]  PCP:  Raina Mina., MD  Cardiologist:  Shirlee More, MD    Referring MD: Raina Mina., MD    ASSESSMENT:    1. Mild CAD   2. Coronary-myocardial bridge   3. Essential hypertension   4. Mixed hyperlipidemia   5. Frequent PVCs    PLAN:    In order of problems listed above:  Back CTA less than a year ago showed mild CAD nonflow limiting and a small coronary bridge.  He is having very nonanginal symptoms and I do not think she requires repeat ischemia evaluation or EKG if anything is improved from a year ago and stable was done a few weeks ago in her PCP office.  Continue medical treatment including aspirin beta-blocker antihypertensive amlodipine and her statin.  I reviewed her cardiac CTA and the recent chest CTA pulmonary embolism protocol and I told her that as opposed to referring for another cardiac CTA or coronary angiogram we will check high-sensitivity troponin if abnormal will treat her as acute coronary syndrome if it is not I do not think I will repeat the studies as her symptoms are really not anginal in nature and with a great deal of GI overlap. Stable hypertension continue current treatment Continue her statin Stable on a beta-blocker and not having symptoms of PVCs   Next appointment: 9 months   Medication Adjustments/Labs and Tests Ordered: Current medicines are reviewed at length with the patient today.  Concerns regarding medicines are outlined above.  Orders Placed This Encounter  Procedures   EKG 12-Lead   Meds ordered this encounter  Medications   DISCONTD: losartan (COZAAR) 50 MG tablet    Sig: Take 1 tablet (50 mg total) by mouth daily.    Dispense:  90 tablet    Refill:  3    Chief complaint follow-up mild CAD recently has been having belching and nonexertional chest pain   History of Present Illness:    Joanne Mitchell is a 76 y.o.  female with a hx of coronary artery calcification on CT scan hypertension dyslipidemia type 2 diabetes and symptomatic PVCs.  She underwent cardiac CTA 06/03/2020 with a relatively low coronary calcium score 34 -47th percentile for age and sex and minimal mixed nonobstructive CAD with a small myocardial coronary bridging segment mid LAD.  She was last seen 08/19/2020.  Compliance with diet, lifestyle and medications: Yes  She has been having severe what she calls indigestion chest pain left axilla.  Struggling with belching nonexertional and can last for hours.  She saw her PCP in the office 05/12/2021 for this she had an EKG performed that showed sinus rhythm first-degree AV block and T wave inversion consider anterior ischemia.  D-dimer was ordered she tells me it was elevated and she went to [facility] and had a CTA no evidence of pulmonary embolism. The symptoms are nonexertional unrelieved with rest and associated with belching. EKG in office today shows sinus rhythm cardio T wave inversion if anything improved Last January Past Medical History:  Diagnosis Date   Anxiety    uses Ativan 4-5x's per month   Arthritis    knee- R   Complication of anesthesia    novacaine /w epinephrine caused her heart to race, she remarks about post op, she had N&V   Diabetes mellitus without complication ([place])    TYPE 2   Dyspepsia  05/04/2016   Essential hypertension 08/17/2015   Last Assessment & Plan:  TRelevant Hx: Course: Daily Update: Today's Plan:this is stable for her at this time and she is taking her meds as directed and will continue to follow for her see her CMP today  Electronically signed by: Mayer Camel, NP 09/16/15 [phone]   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 reports MD told her she  has a "floppy valve", seen by Dr. Bettina Gavia    Hypertension    Malaise and fatigue 08/17/2015   Last Assessment & Plan:  Relevant Hx: Course: Daily Update: Today's Plan:she still works about 4 day weeks but there is a lot of stress at work and that is tiring to her she is trying to rest more but still has some ease to tire will see her labs  Electronically signed by: Mayer Camel, NP 09/16/15 [phone] Overview:  Last Assessment & Plan:  Relevant Hx: Course: Daily Update: Today's Pl   Maternal blood transfusion    45 yrs. ago    Mixed hyperlipidemia 08/17/2015   Last Assessment & Plan:  Relevant Hx: Course: Daily Update: Today's Plan:update her labs for her today  Electronically signed by: Mayer Camel, NP 09/16/15 [phone] Overview:  Last Assessment & Plan:  Relevant Hx: Course: Daily Update: Today's Plan:update her labs for her today  Electronically signed by: Mayer Camel, NP 09/16/15 0949   Non-seasonal allergic rhinitis 08/17/2015   Last Assessment & Plan:  Relevant Hx: Course: Daily Update: Today's Plan:she has been having a difficult time with this and she is advised to use her flonase in the am and take zyrtec at [facility] and see if that helps control the drainage for her  Electronically signed by: Mayer Camel, NP 09/16/15 0947   Phlebitis    during pregnancy - 30 yrs. 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   Last Assessment & Plan:  Relevant Hx: Course: Daily Update: Today's Plan:she is better with her knees her hips do bother her some but she feels that is better since her surgery and increasing her exercise  Electronically signed by: Mayer Camel, NP 09/16/15 [phone] Overview:  Last Assessment & Plan:  Relevant Hx: Course: Daily Update: Today's Plan:she is better with her knees her hips d   S/P total knee arthroplasty 08/10/2014   Stress incontinence 08/17/2015   Last  Assessment & Plan:  Relevant Hx: Course: Daily Update: Today's Plan:she feels this is stable for her at this time and will follow  Electronically signed by: Mayer Camel, NP 09/16/15 0950   Type 2 diabetes mellitus without complications ([place]) 05/03/1592   Last Assessment & Plan:  Relevant Hx: Course: Daily Update: Today's Plan:discussed her sugars with her she is doing better now that she is back at the [facility] and she is working on her exercise since her knee surgery, she did so well with. She is hopeful to keep losing weight. She is having stable sugars despite having bronchitis as well  Electronically signed by: Mayer Camel, NP 05/18   Vertigo 09/19/2017   Weakness 09/05/2017    Past Surgical History:  Procedure Laterality Date   APPENDECTOMY     CATARACT EXTRACTION     CHOLECYSTECTOMY  HERNIA REPAIR     umbilical    JOINT REPLACEMENT Left 2009   TOTAL KNEE ARTHROPLASTY Right 08/10/2014   Procedure: TOTAL KNEE ARTHROPLASTY;  Surgeon: Vickey Huger, MD;  Location: [facility];  Service: Orthopedics;  Laterality: Right;    Current Medications: Current Meds  Medication Sig   acetaminophen (TYLENOL) 325 MG tablet Take 2 tablets by mouth as needed for pain.   amLODipine (NORVASC) 5 MG tablet Take 1 tablet by mouth daily.   aspirin 81 MG tablet Take 81 mg by mouth daily.   Cholecalciferol 50 MCG (2000 UT) CAPS Take 2,000 Units by mouth daily.   fluticasone (FLONASE) 50 MCG/ACT nasal spray Place 1 spray into both nostrils daily as needed for allergies or rhinitis.   glimepiride (AMARYL) 2 MG tablet Take 2 mg by mouth daily with breakfast.   hydrochlorothiazide (MICROZIDE) 12.5 MG capsule Take 12.5 mg by mouth daily.   loratadine (CLARITIN) 10 MG tablet Take 10 mg by mouth daily.   LORazepam (ATIVAN) 0.5 MG tablet Take 0.5 mg by mouth 2 (two) times daily as needed for anxiety.   metoprolol tartrate (LOPRESSOR) 25 MG tablet Take 1 tablet (25 mg total) by mouth 2 (two) times  daily as needed. (Patient taking differently: Take 25 mg by mouth 2 (two) times daily as needed (Heart skipping).)   Multiple Vitamins-Minerals (MULTIVITAMIN WITH MINERALS) tablet Take 1 tablet by mouth daily.   nitroGLYCERIN (NITROSTAT) 0.4 MG SL tablet Place 1 tablet (0.4 mg total) under the tongue every 5 (five) minutes as needed for chest pain.   Olopatadine HCl (PATADAY OP) Apply to eye as needed for allergies.   omeprazole (PRILOSEC) 40 MG capsule Take 40 mg by mouth daily.   pravastatin (PRAVACHOL) 20 MG tablet Take 1 tablet (20 mg total) by mouth daily.   sucralfate (CARAFATE) 1 g tablet Take 1 g by mouth 2 (two) times daily.   [DISCONTINUED] losartan (COZAAR) 50 MG tablet Take 1 tablet (50 mg total) by mouth daily.     Allergies:   Levofloxacin and Procaine   Social History   Socioeconomic History   Marital status: Married    Spouse name: Not on file   Number of children: Not on file   Years of education: Not on file   Highest education level: Not on file  Occupational History   Not on file  Tobacco Use   Smoking status: Never    Passive exposure: Past   Smokeless tobacco: Never  Vaping Use   Vaping Use: Never used  Substance and Sexual Activity   Alcohol use: Yes    Comment: "glass of wine once & a while"   Drug use: No   Sexual activity: Not on file  Other Topics Concern   Not on file  Social History Narrative   Not on file   Social Determinants of Health   Financial Resource Strain: Not on file  Food Insecurity: Not on file  Transportation Needs: Not on file  Physical Activity: Not on file  Stress: Not on file  Social Connections: Not on file     Family History: The patient's family history includes Lung cancer in her mother and sister; Stomach cancer in her paternal grandmother; Stroke in her mother. ROS:   Please see the history of present illness.    All other systems reviewed and are negative.  EKGs/Labs/Other Studies Reviewed:    The following  studies were reviewed today:  EKG:  EKG ordered today and personally reviewed.  The ekg ordered today demonstrates sinus rhythm Persky AV block ST-T abnormality ischemic V2 to V3 improved from the year prior  Recent Labs: Her last lipid profile 11/24/2020 cholesterol 166 triglycerides 82 HDL 46 LDL 111. Recent BMP normal creatinine 0.68 potassium 4.40106 2023 and her hemoglobin was 15.7  Physical Exam:    VS:  BP 116/74 (BP Location: Right Arm)    Pulse 94    Ht 5\' 3"  (1.6 m)    Wt 207 lb (93.9 kg)    SpO2 97%    BMI 36.67 kg/m     Wt Readings from Last 3 Encounters:  05/20/21 207 lb (93.9 kg)  08/19/20 210 lb (95.3 kg)  05/25/20 211 lb (95.7 kg)     GEN:  Well nourished, well developed in no acute distress HEENT: Normal NECK: No JVD; No carotid bruits LYMPHATICS: No lymphadenopathy CARDIAC: RRR, no murmurs, rubs, gallops RESPIRATORY:  Clear to auscultation without rales, wheezing or rhonchi  ABDOMEN: Soft, non-tender, non-distended MUSCULOSKELETAL:  No edema; No deformity  SKIN: Warm and dry NEUROLOGIC:  Alert and oriented x 3 PSYCHIATRIC:  Normal affect    Signed, Shirlee More, MD  05/20/2021 3:47 PM    [facility]

## 2021-05-20 ENCOUNTER — Ambulatory Visit: Payer: Medicare Other | Admitting: Cardiology

## 2021-05-20 ENCOUNTER — Encounter: Payer: Self-pay | Admitting: Cardiology

## 2021-05-20 ENCOUNTER — Other Ambulatory Visit: Payer: Self-pay

## 2021-05-20 VITALS — BP 116/74 | HR 94 | Ht 63.0 in | Wt 207.0 lb

## 2021-05-20 DIAGNOSIS — I493 Ventricular premature depolarization: Secondary | ICD-10-CM

## 2021-05-20 DIAGNOSIS — Q245 Malformation of coronary vessels: Secondary | ICD-10-CM | POA: Diagnosis not present

## 2021-05-20 DIAGNOSIS — I2510 Atherosclerotic heart disease of native coronary artery without angina pectoris: Secondary | ICD-10-CM | POA: Diagnosis not present

## 2021-05-20 DIAGNOSIS — E782 Mixed hyperlipidemia: Secondary | ICD-10-CM | POA: Diagnosis not present

## 2021-05-20 DIAGNOSIS — I10 Essential (primary) hypertension: Secondary | ICD-10-CM

## 2021-05-20 DIAGNOSIS — R079 Chest pain, unspecified: Secondary | ICD-10-CM

## 2021-05-20 MED ORDER — LOSARTAN POTASSIUM 50 MG PO TABS: 50.0000 mg | ORAL_TABLET | Freq: Every day | ORAL | 3 refills | Status: DC

## 2021-05-20 NOTE — Patient Instructions (Signed)
Medication Instructions:  Your physician recommends that you continue on your current medications as directed. Please refer to the Current Medication list given to you today.  *If you need a refill on your cardiac medications before your next appointment, please call your pharmacy*   Lab Work: Your physician recommends that you return for lab work in: TODAY Troponin If you have labs (blood work) drawn today and your tests are completely normal, you will receive your results only by: [place] (if you have MyChart) OR A paper copy in the mail If you have any lab test that is abnormal or we need to change your treatment, we will call you to review the results.   Testing/Procedures: None   Follow-Up: At [facility], you and your health needs are our priority.  As part of our continuing mission to provide you with exceptional heart care, we have created designated Provider Care Teams.  These Care Teams include your primary Cardiologist (physician) and Advanced Practice Providers (APPs -  Physician Assistants and Nurse Practitioners) who all work together to provide you with the care you need, when you need it.  We recommend signing up for the patient portal called "MyChart".  Sign up information is provided on this After Visit Summary.  MyChart is used to connect with patients for Virtual Visits (Telemedicine).  Patients are able to view lab/test results, encounter notes, upcoming appointments, etc.  Non-urgent messages can be sent to your provider as well.   To learn more about what you can do with MyChart, go to [email or web address].    Your next appointment:   9 month(s)  The format for your next appointment:   In Person  Provider:   Shirlee More, MD    Other Instructions

## 2021-05-21 LAB — TROPONIN T: Troponin T (Highly Sensitive): 9 ng/L (ref 0–14)

## 2021-05-23 ENCOUNTER — Telehealth: Payer: Self-pay

## 2021-05-23 DIAGNOSIS — R072 Precordial pain: Secondary | ICD-10-CM

## 2021-05-23 NOTE — Telephone Encounter (Signed)
-----   Message from Richardo Priest, MD sent at 05/22/2021 11:18 AM EST ----- Fortunately high-sensitivity troponin remains normal  I change my mind and I think we should repeat her cardiac CTA there is just a lot of indecision whether her problem is cardiac or GI.  With a normal troponin I do not think she needs coronary angiography and if we could set this up facilitated I think it would provide direction before she is seen for GI evaluation.

## 2021-05-23 NOTE — Telephone Encounter (Signed)
Spoke with patient regarding results and recommendation.  Patient verbalizes understanding and is agreeable to plan of care. Advised patient to call back with any issues or concerns.      Your cardiac CT will be scheduled at the below location:   [facility] [address] [place], [place] [address] [phone]  If scheduled at [facility], please arrive at the [facility] main entrance (entrance A) of [facility] 30 minutes prior to test start time. You can use the FREE valet parking offered at the main entrance (encouraged to control the heart rate for the test) Proceed to the [facility] Radiology Department (first floor) to check-in and test prep.  Please follow these instructions carefully (unless otherwise directed):  On the Night Before the Test: Be sure to Drink plenty of water. Do not consume any caffeinated/decaffeinated beverages or chocolate 12 hours prior to your test. Do not take any antihistamines 12 hours prior to your test.  On the Day of the Test: Drink plenty of water until 1 hour prior to the test. Do not eat any food 4 hours prior to the test. You may take your regular medications prior to the test.  Take metoprolol (Lopressor) two hours prior to test. FEMALES- please wear underwire-free bra if available, avoid dresses & tight clothing       After the Test: Drink plenty of water. After receiving IV contrast, you may experience a mild flushed feeling. This is normal. On occasion, you may experience a mild rash up to 24 hours after the test. This is not dangerous. If this occurs, you can take Benadryl 25 mg and increase your fluid intake. If you experience trouble breathing, this can be serious. If it is severe call 911 IMMEDIATELY. If it is mild, please call our office. If you take any of these medications: Glipizide/Metformin, Avandament, Glucavance, please do not take 48 hours after completing test unless otherwise  instructed.  We will call to schedule your test 2-4 weeks out understanding that some insurance companies will need an authorization prior to the service being performed.   For non-scheduling related questions, please contact the cardiac imaging nurse navigator should you have any questions/concerns: Marchia Bond, Cardiac Imaging Nurse Navigator Gordy Clement, Cardiac Imaging Nurse Navigator  Heart and Vascular Services Direct Office Dial: [phone]   For scheduling needs, including cancellations and rescheduling, please call [place], [phone].

## 2021-05-27 ENCOUNTER — Telehealth (HOSPITAL_COMMUNITY): Payer: Self-pay | Admitting: *Deleted

## 2021-05-27 NOTE — Telephone Encounter (Signed)
Patient returning call regarding upcoming cardiac imaging study; pt verbalizes understanding of appt date/time, parking situation and where to check in, pre-test NPO status and medications ordered, and verified current allergies; name and call back number provided for further questions should they arise  Joanne Clement RN [place] [facility] [phone] office [phone] cell  Patient to take 50mg  metoprolol tartrate if HR greater than 65bpm. She is aware she can take her ativan if she has a ride for the test and will arrive at 11am for her 11:30am scan.

## 2021-05-27 NOTE — Telephone Encounter (Signed)
Attempted to call patient regarding upcoming cardiac CT appointment. °Left message on voicemail with name and callback number ° °Teal Raben RN Navigator Cardiac Imaging ° Heart and Vascular Services °[phone] Office °[phone] Cell ° °

## 2021-05-30 ENCOUNTER — Other Ambulatory Visit: Payer: Self-pay

## 2021-05-30 ENCOUNTER — Ambulatory Visit (HOSPITAL_COMMUNITY)
Admission: RE | Admit: 2021-05-30 | Discharge: 2021-05-30 | Disposition: A | Discharge status: Home / Self Care | Payer: Medicare Other | Source: Ambulatory Visit | Attending: Cardiology | Admitting: Cardiology

## 2021-05-30 ENCOUNTER — Encounter (HOSPITAL_COMMUNITY): Payer: Self-pay

## 2021-05-30 DIAGNOSIS — R072 Precordial pain: Secondary | ICD-10-CM | POA: Insufficient documentation

## 2021-05-30 MED ORDER — NITROGLYCERIN 0.4 MG SL SUBL: 0.8000 mg | SUBLINGUAL_TABLET | Freq: Once | SUBLINGUAL | Status: AC

## 2021-05-30 MED ORDER — IOHEXOL 350 MG/ML SOLN
95.0000 mL | Freq: Once | INTRAVENOUS | Status: AC | PRN
  Administered 2021-05-30: 95 mL via INTRAVENOUS

## 2021-05-30 MED ORDER — NITROGLYCERIN 0.4 MG SL SUBL
SUBLINGUAL_TABLET | SUBLINGUAL | Status: AC
  Administered 2021-05-30: 0.8 mg via SUBLINGUAL
  Filled 2021-05-30: qty 2

## 2021-06-01 ENCOUNTER — Telehealth: Payer: Self-pay

## 2021-06-01 ENCOUNTER — Telehealth: Payer: Self-pay | Admitting: Cardiology

## 2021-06-01 NOTE — Telephone Encounter (Signed)
Spoke with patient regarding results and recommendation.  Patient verbalizes understanding and is agreeable to plan of care. Advised patient to call back with any issues or concerns.  

## 2021-06-01 NOTE — Telephone Encounter (Signed)
Patient was calling in wanting to get the results from her test. Please advise

## 2021-06-01 NOTE — Telephone Encounter (Signed)
-----   Message from Richardo Priest, MD sent at 06/01/2021 11:08 AM EST ----- Overall this is a good report  Atherosclerosis equals calcium score is moderately elevated  Minimal changes in the coronary arteries plaque does not need heart catheterization surgery or stent  This is quite reassuring  She is on the right medications no change

## 2021-06-16 ENCOUNTER — Ambulatory Visit: Payer: Medicare Other | Admitting: Cardiology

## 2021-06-24 DIAGNOSIS — R8290 Unspecified abnormal findings in urine: Secondary | ICD-10-CM | POA: Insufficient documentation

## 2021-07-05 DIAGNOSIS — L723 Sebaceous cyst: Secondary | ICD-10-CM

## 2021-07-05 HISTORY — DX: Sebaceous cyst: L72.3

## 2021-07-06 ENCOUNTER — Telehealth: Payer: Self-pay | Admitting: Gastroenterology

## 2021-07-06 NOTE — Telephone Encounter (Signed)
Good Afternoon Dr. Lyndel Safe,  ? ?We received patients last colon and path report from December of 2022 from [facility]. Patient has a referral in to be seen by you for GERD. I will be sending records for you to review, will you review and please advise on scheduling? ? ? ? Thank you.   ?

## 2021-07-06 NOTE — Telephone Encounter (Signed)
No problems.  Thanks for letting me know RG 

## 2021-07-06 NOTE — Telephone Encounter (Signed)
FYI this is a transfer of care from [facility], are you okay with that?  ?

## 2021-07-14 ENCOUNTER — Encounter: Payer: Self-pay | Admitting: Gastroenterology

## 2021-07-14 NOTE — Telephone Encounter (Signed)
Okay with transfer ?Please have the notes ready for OV ?Routine OV ?RG ?

## 2021-08-09 ENCOUNTER — Encounter: Payer: Self-pay | Admitting: Gastroenterology

## 2021-08-09 ENCOUNTER — Ambulatory Visit (INDEPENDENT_AMBULATORY_CARE_PROVIDER_SITE_OTHER): Payer: Medicare Other | Admitting: Gastroenterology

## 2021-08-09 VITALS — BP 136/86 | HR 77 | Ht 63.0 in | Wt 214.2 lb

## 2021-08-09 DIAGNOSIS — K449 Diaphragmatic hernia without obstruction or gangrene: Secondary | ICD-10-CM | POA: Diagnosis not present

## 2021-08-09 DIAGNOSIS — K219 Gastro-esophageal reflux disease without esophagitis: Secondary | ICD-10-CM

## 2021-08-09 MED ORDER — PANTOPRAZOLE SODIUM 40 MG PO TBEC: 40.0000 mg | DELAYED_RELEASE_TABLET | Freq: Every day | ORAL | 4 refills | Status: DC

## 2021-08-09 NOTE — Patient Instructions (Signed)
If you are age 76 or older, your body mass index should be between 23-30. Your Body mass index is 37.95 kg/m?Joanne Mitchell If this is out of the aforementioned range listed, please consider follow up with your Primary Care Provider. ? ?If you are age 74 or younger, your body mass index should be between 19-25. Your Body mass index is 37.95 kg/m?Joanne Mitchell If this is out of the aformentioned range listed, please consider follow up with your Primary Care Provider.  ? ?________________________________________________________ ? ?The [facility] providers would like to encourage you to use [facility] to communicate with providers for non-urgent requests or questions.  Due to long hold times on the telephone, sending your provider a message by [facility] may be a faster and more efficient way to get a response.  Please allow 48 business hours for a response.  Please remember that this is for non-urgent requests.  ?_______________________________________________________ ? ?We have sent the following medications to your pharmacy for you to pick up at your convenience: ?Protonix ? ?You have been given information on GERD. ? ?Please call with any questions or concerns. ? ?Thank you, ? ?Dr. Jackquline Denmark ? ? ?Gastroesophageal Reflux Disease, Adult ?Gastroesophageal reflux (GER) happens when acid from the stomach flows up into the tube that connects the mouth and the stomach (esophagus). Normally, food travels down the esophagus and stays in the stomach to be digested. With GER, food and stomach acid sometimes move back up into the esophagus. ?You may have a disease called gastroesophageal reflux disease (GERD) if the reflux: ?Happens often. ?Causes frequent or very bad symptoms. ?Causes problems such as damage to the esophagus. ?When this happens, the esophagus becomes sore and swollen. Over time, GERD can make small holes (ulcers) in the lining of the esophagus. ?What are the causes? ?This condition is caused by a problem with the muscle between the  esophagus and the stomach. When this muscle is weak or not normal, it does not close properly to keep food and acid from coming back up from the stomach. ?The muscle can be weak because of: ?Tobacco use. ?Pregnancy. ?Having a certain type of hernia (hiatal hernia). ?Alcohol use. ?Certain foods and drinks, such as coffee, chocolate, onions, and peppermint. ?What increases the risk? ?Being overweight. ?Having a disease that affects your connective tissue. ?Taking NSAIDs, such a ibuprofen. ?What are the signs or symptoms? ?Heartburn. ?Difficult or painful swallowing. ?The feeling of having a lump in the throat. ?A bitter taste in the mouth. ?Bad breath. ?Having a lot of saliva. ?Having an upset or bloated stomach. ?Burping. ?Chest pain. Different conditions can cause chest pain. Make sure you see your doctor if you have chest pain. ?Shortness of breath or wheezing. ?A long-term cough or a cough at night. ?Wearing away of the surface of teeth (tooth enamel). ?Weight loss. ?How is this treated? ?Making changes to your diet. ?Taking medicine. ?Having surgery. ?Treatment will depend on how bad your symptoms are. ?Follow these instructions at home: ?Eating and drinking ? ?Follow a diet as told by your doctor. You may need to avoid foods and drinks such as: ?Coffee and tea, with or without caffeine. ?Drinks that contain alcohol. ?Energy drinks and sports drinks. ?Bubbly (carbonated) drinks or sodas. ?Chocolate and cocoa. ?Peppermint and mint flavorings. ?Garlic and onions. ?Horseradish. ?Spicy and acidic foods. These include peppers, chili powder, curry powder, vinegar, hot sauces, and BBQ sauce. ?Citrus fruit juices and citrus fruits, such as oranges, lemons, and limes. ?Tomato-based foods. These include red sauce, chili, salsa,  and pizza with red sauce. ?Fried and fatty foods. These include donuts, french fries, potato chips, and high-fat dressings. ?High-fat meats. These include hot dogs, rib eye steak, sausage, ham, and  bacon. ?High-fat dairy items, such as whole milk, butter, and cream cheese. ?Eat small meals often. Avoid eating large meals. ?Avoid drinking large amounts of liquid with your meals. ?Avoid eating meals during the 2-3 hours before bedtime. ?Avoid lying down right after you eat. ?Do not exercise right after you eat. ?Lifestyle ? ?Do not smoke or use any products that contain nicotine or tobacco. If you need help quitting, ask your doctor. ?Try to lower your stress. If you need help doing this, ask your doctor. ?If you are overweight, lose an amount of weight that is healthy for you. Ask your doctor about a safe weight loss goal. ?General instructions ?Pay attention to any changes in your symptoms. ?Take over-the-counter and prescription medicines only as told by your doctor. ?Do not take aspirin, ibuprofen, or other NSAIDs unless your doctor says it is okay. ?Wear loose clothes. Do not wear anything tight around your waist. ?Raise (elevate) the head of your bed about 6 inches (15 cm). You may need to use a wedge to do this. ?Avoid bending over if this makes your symptoms worse. ?Keep all follow-up visits. ?Contact a doctor if: ?You have new symptoms. ?You lose weight and you do not know why. ?You have trouble swallowing or it hurts to swallow. ?You have wheezing or a cough that keeps happening. ?You have a hoarse voice. ?Your symptoms do not get better with treatment. ?Get help right away if: ?You have sudden pain in your arms, neck, jaw, teeth, or back. ?You suddenly feel sweaty, dizzy, or light-headed. ?You have chest pain or shortness of breath. ?You vomit and the vomit is green, yellow, or black, or it looks like blood or coffee grounds. ?You faint. ?Your poop (stool) is red, bloody, or black. ?You cannot swallow, drink, or eat. ?These symptoms may represent a serious problem that is an emergency. Do not wait to see if the symptoms will go away. Get medical help right away. Call your local emergency services (911  in the [place] Do not drive yourself to the hospital. ?Summary ?If a person has gastroesophageal reflux disease (GERD), food and stomach acid move back up into the esophagus and cause symptoms or problems such as damage to the esophagus. ?Treatment will depend on how bad your symptoms are. ?Follow a diet as told by your doctor. ?Take all medicines only as told by your doctor. ?This information is not intended to replace advice given to you by your health care provider. Make sure you discuss any questions you have with your health care provider. ?Document Revised: 10/27/2019 Document Reviewed: 10/27/2019 ?Elsevier Patient Education ? 2022 [place]. ? ? ? ? ? ? ?We want to thank you for trusting [facility] with your care. All of our staff and providers value the relationships we have built with our patients, and it is an honor to care for you.  ? ?We are writing to let you know that [facility] will close on Sep 12, 2021, and we invite you to continue to see Dr. Carmell Austria and Gerrit Heck at the [facility] office location. We are consolidating our serices at these [facility] practices to better provide care. Our office staff will work with you to ensure a seamless transition.  ? ?Gerrit Heck, DO -Dr. Bryan Lemma will  be movig to [facility] at [place]. [address], [place], [place] [address], effective Sep 12, 2021.  Contact [phone] to schedule an appointment with him.  ? ?Carmell Austria, MD- Dr. Lyndel Safe will be movig to [facility] at [address], [place], [place] [address], effective Sep 12, 2021.  Contact [phone] to schedule an appointment with him.  ? ?Requesting Medical Records ?If you need to request your medical records, please follow the instructions below. Your medical records are confidential, and a copy can be transferred to another provider or released to you or another person you designate only with  your permission. ? ?There are several ways to request your medical records: ?Requests for medical records can be submitted through our practice.   ?You can also request your records electronically, in your

## 2021-08-09 NOTE — Progress Notes (Signed)
? ? ?Chief Complaint: GERD ? ?Referring Provider:  Bess Harvest*    ? ? ?ASSESSMENT AND PLAN;  ? ?#1. GERD with small HH ? ?#2. IBS-C ? ? ?Plan: ?-Change omeprazole to protonix '40mg'$  [po QD #90, 4RF ?-If still with problems, EGD. She wants to wait currently.  She would reconsider if continued symptoms. ?-Broch GERD ? ? ?HPI:   ? ?Joanne Mitchell is a 76 y.o. female  ?With H/O CAD, HTN, HLD, DM2, history of uterine CA, anxiety/depression, osteoarthritis, history of kidney stones, obesity. ? ?Has been having indigestion and waterbrash, more so since Christmas 2022 when she had upper respiratory tract infection (not COVID/flu).  She denies having any odynophagia or dysphagia.  She has been on omeprazole for over 20 years.  Recently as she ran out of prescription, she started taking over-the-counter 20 mg.  She has also been given a trial of Carafate without significant benefit.  She is not keen on getting repeat EGD at this time. ? ?She has seen Dr. Bettina Gavia.  He does not feel above symptoms are due to heart problems. ? ?She underwent extensive GI evaluation by Dr. Lyda Jester.  Her last EGD was on 10/17/2018 which showed small hiatal hernia and diffuse gastritis.  Small bowel biopsies were negative for celiac disease.  H. pylori was negative. ? ?She has longstanding history of constipation with occasional alternating diarrhea.  Constipation is better on as needed MiraLAX. ? ?Also been diagnosed as having rectocele. ? ? ?Past GI evaluation: ? ?Colonoscopy 03/31/2021 by Dr. Lyda Jester ?-Diminutive colonic polyp s/p polypectomy.  Biopsies tubular adenoma ?-Left colonic diverticulosis ?-Small internal hemorrhoid ?-Per notes consider repeating colonoscopy in 5 years at age 44 ?-Previous colonoscopies 08/2015 small diminutive adenomatous polyps, colonoscopy 06/2010 diminutive adenomatous polyps, colonoscopy 03/2000 hyperplastic polyps and small hemorrhoids. ? ?EGD 10/17/2018 ?-Diffuse gastritis ?-Small hiatal  hernia ?-Negative small bowel biopsies for celiac, negative H. pylori biopsies. ? ?She is s/p appendicectomy, cholecystectomy, hysterectomy, hernia repair. ? ? ?Past Medical History:  ?Diagnosis Date  ? Anxiety   ? uses Ativan 4-5x's per month  ? Arthritis   ? knee- R  ? Complication of anesthesia   ? novacaine /w epinephrine caused her heart to race, she remarks about post op, she had N&V  ? Diabetes mellitus without complication ([place])   ? TYPE 2  ? Dyspepsia 05/04/2016  ? Essential hypertension 08/17/2015  ? Last Assessment & Plan:  TRelevant Hx: Course: Daily Update: Today's Plan:this is stable for her at this time and she is taking her meds as directed and will continue to follow for her see her CMP today  Electronically signed by: Mayer Camel, NP 09/16/15 [phone]  ? 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 Gastroesophageal reflux disease with esophagitis 05/04/2017  ? Generalized abdominal pain 05/04/2016  ? GERD (gastroesophageal reflux disease)   ? History of stress test   ? echo /w stress test, on treadmill , pt. 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 Bettina Gavia   ? Hypertension   ? Malaise and fatigue 08/17/2015  ? Last Assessment & Plan:  Relevant Hx: Course: Daily Update: Today's Plan:she still works about 4 day weeks but there is a lot of stress at work and that is tiring to her she is trying to rest more but still has some ease to tire will see her labs  Electronically signed by: Mayer Camel, NP 09/16/15 [phone] Overview:  Last Assessment & Plan:  Relevant Hx: Course: Daily Update:  Today's Pl  ? Maternal blood transfusion   ? 57 yrs. ago   ? Mixed hyperlipidemia 08/17/2015  ? Last Assessment & Plan:  Relevant Hx: Course: Daily Update: Today's Plan:update her labs for her today  Electronically signed by: Mayer Camel, NP 09/16/15 [phone] Overview:  Last Assessment & Plan:  Relevant Hx: Course: Daily Update: Today's Plan:update her labs for her today  Electronically signed by:  Mayer Camel, NP 09/16/15 [phone]  ? Non-seasonal allergic rhinitis 08/17/2015  ? Last Assessment & Plan:  Relevant Hx: Course: Daily Update: Today's Plan:she has been having a difficult time with this and she is advised to use her flonase in the am and take zyrtec at [facility] and see if that helps control the drainage for her  Electronically signed by: Mayer Camel, NP 09/16/15 [phone]  ? Phlebitis   ? during pregnancy - 14 yrs. ago  ? PONV (postoperative nausea and vomiting)   ? Primary osteoarthritis involving multiple joints 08/17/2015  ? Last Assessment & Plan:  Relevant Hx: Course: Daily Update: Today's Plan:she is better with her knees her hips do bother her some but she feels that is better since her surgery and increasing her exercise  Electronically signed by: Mayer Camel, NP 09/16/15 [phone] Overview:  Last Assessment & Plan:  Relevant Hx: Course: Daily Update: Today's Plan:she is better with her knees her hips d  ? 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 Stress incontinence 08/17/2015  ? Last Assessment & Plan:  Relevant Hx: Course: Daily Update: Today's Plan:she feels this is stable for her at this time and will follow  Electronically signed by: Mayer Camel, NP 09/16/15 [phone]  ? Type 2 diabetes mellitus without complications ([place]) 3/55/7322  ? Last Assessment & Plan:  Relevant Hx: Course: Daily Update: Today's Plan:discussed her sugars with her she is doing better now that she is back at the [facility] and she is working on her exercise since her knee surgery, she did so well with. She is hopeful to keep losing weight. She is having stable sugars despite having bronchitis as well  Electronically signed by: Mayer Camel, NP 05/18  ? Vertigo 09/19/2017  ? Weakness 09/05/2017  ? ? ?Past Surgical History:  ?Procedure Laterality Date  ? ABDOMINAL HYSTERECTOMY  2022  ? APPENDECTOMY    ? CATARACT EXTRACTION    ? CHOLECYSTECTOMY    ? COLONOSCOPY  03/31/2001  ? Dr  Lyda Jester. Diminutive colon polyp (1). Diverticular disease left colon. Small internal hemorrhoids  ? HERNIA REPAIR    ? umbilical   ? JOINT REPLACEMENT Left 2009  ? TOTAL KNEE ARTHROPLASTY Right 08/10/2014  ? Procedure: TOTAL KNEE ARTHROPLASTY;  Surgeon: Vickey Huger, MD;  Location: [place];  Service: Orthopedics;  Laterality: Right;  ? ? ?Family History  ?Problem Relation Age of Onset  ? Stroke Mother   ? Lung cancer Mother   ? Lung cancer Father   ? Lung cancer Sister   ? Stomach cancer Paternal Grandmother   ? Breast cancer Daughter   ? Colon cancer Neg Hx   ? Rectal cancer Neg Hx   ? Pancreatic cancer Neg Hx   ? Esophageal cancer Neg Hx   ? ? ?Social History  ? ?Tobacco Use  ? Smoking status: Never  ?  Passive exposure: Past  ? Smokeless tobacco: Never  ?Vaping Use  ? Vaping Use: Never used  ?Substance Use Topics  ? Alcohol use: Yes  ?  Comment: "glass of wine  once & a while"  ? Drug use: No  ? ? ?Current Outpatient Medications  ?Medication Sig Dispense Refill  ? acetaminophen (TYLENOL) 325 MG tablet Take 2 tablets by mouth as needed for pain.    ? amLODipine (NORVASC) 5 MG tablet Take 1 tablet by mouth daily.    ? aspirin 81 MG tablet Take 81 mg by mouth daily.    ? Cholecalciferol 50 MCG (2000 UT) CAPS Take 2,000 Units by mouth daily.    ? fluticasone (FLONASE) 50 MCG/ACT nasal spray Place 1 spray into both nostrils daily as needed for allergies or rhinitis.    ? glimepiride (AMARYL) 2 MG tablet Take 2 mg by mouth daily with breakfast.    ? hydrochlorothiazide (MICROZIDE) 12.5 MG capsule Take 12.5 mg by mouth daily.    ? loratadine (CLARITIN) 10 MG tablet Take 10 mg by mouth daily.    ? LORazepam (ATIVAN) 0.5 MG tablet Take 0.5 mg by mouth 2 (two) times daily as needed for anxiety.    ? metoprolol tartrate (LOPRESSOR) 25 MG tablet Take 1 tablet (25 mg total) by mouth 2 (two) times daily as needed. (Patient taking differently: Take 25 mg by mouth 2 (two) times daily as needed (Heart skipping).) 60 tablet 11  ?  Multiple Vitamins-Minerals (MULTIVITAMIN WITH MINERALS) tablet Take 1 tablet by mouth daily.    ? Olopatadine HCl (PATADAY OP) Apply to eye as needed for allergies.    ? omeprazole (PRILOSEC) 40 MG capsule Take 4

## 2022-01-19 DIAGNOSIS — R911 Solitary pulmonary nodule: Secondary | ICD-10-CM

## 2022-01-19 HISTORY — DX: Solitary pulmonary nodule: R91.1

## 2022-04-05 ENCOUNTER — Ambulatory Visit: Payer: Medicare Other | Attending: Cardiology | Admitting: Cardiology

## 2022-04-05 ENCOUNTER — Ambulatory Visit: Payer: Medicare Other | Attending: Cardiology

## 2022-04-05 ENCOUNTER — Encounter: Payer: Self-pay | Admitting: Cardiology

## 2022-04-05 VITALS — BP 146/70 | HR 88 | Ht 63.0 in | Wt 218.0 lb

## 2022-04-05 DIAGNOSIS — I493 Ventricular premature depolarization: Secondary | ICD-10-CM | POA: Diagnosis not present

## 2022-04-05 DIAGNOSIS — I10 Essential (primary) hypertension: Secondary | ICD-10-CM

## 2022-04-05 DIAGNOSIS — I2510 Atherosclerotic heart disease of native coronary artery without angina pectoris: Secondary | ICD-10-CM | POA: Diagnosis not present

## 2022-04-05 DIAGNOSIS — E782 Mixed hyperlipidemia: Secondary | ICD-10-CM

## 2022-04-05 DIAGNOSIS — Q245 Malformation of coronary vessels: Secondary | ICD-10-CM

## 2022-04-05 NOTE — Patient Instructions (Signed)
Medication Instructions:  Your physician recommends that you continue on your current medications as directed. Please refer to the Current Medication list given to you today.  *If you need a refill on your cardiac medications before your next appointment, please call your pharmacy*   Lab Work: None If you have labs (blood work) drawn today and your tests are completely normal, you will receive your results only by: [place] (if you have MyChart) OR A paper copy in the mail If you have any lab test that is abnormal or we need to change your treatment, we will call you to review the results.   Testing/Procedures: A zio monitor was ordered today. It will remain on for 7 days. You will then return monitor and event diary in provided box. It takes 1-2 weeks for report to be downloaded and returned to [place]. We will call you with the results. If monitor falls off or has orange flashing light, please call [employer] for further instructions.     Follow-Up: At [facility], you and your health needs are our priority.  As part of our continuing mission to provide you with exceptional heart care, we have created designated Provider Care Teams.  These Care Teams include your primary Cardiologist (physician) and Advanced Practice Providers (APPs -  Physician Assistants and Nurse Practitioners) who all work together to provide you with the care you need, when you need it.  We recommend signing up for the patient portal called "MyChart".  Sign up information is provided on this After Visit Summary.  MyChart is used to connect with patients for Virtual Visits (Telemedicine).  Patients are able to view lab/test results, encounter notes, upcoming appointments, etc.  Non-urgent messages can be sent to your provider as well.   To learn more about what you can do with MyChart, go to [email or web address].    Your next appointment:   6 month(s)  The format for your next appointment:   In  Person  Provider:   Shirlee More, MD    Other Instructions None  Important Information About Sugar

## 2022-04-05 NOTE — Progress Notes (Signed)
Cardiology Office Note:    Date:  04/05/2022   ID:  Joanne Mitchell, DOB 17-Mar-1946, MRN [id number]  PCP:  Raina Mina., MD  Cardiologist:  Shirlee More, MD    Referring MD: Raina Mina., MD    ASSESSMENT:    1. Mild CAD   2. Coronary-myocardial bridge   3. Frequent PVCs   4. Essential hypertension   5. Mixed hyperlipidemia    PLAN:    In order of problems listed above:  She has done well with CAD is having symptoms possible angina and frequent vague I encouraged her to continue her current medical regimen and get involved in a regular exercise program.  At this point I would not repeat an ischemia evaluation she will continue aspirin calcium channel blocker and statin. See above stable Having more palpitation we will recheck an event monitor and continue her beta-blocker as needed if she has a high frequency of PVCs I will keep her on a beta-blocker daily Well-controlled asked her to record trend and bring blood pressures with her in the office in the future Continue her statin effective controlling dyslipidemia   Next appointment: 6 months   Medication Adjustments/Labs and Tests Ordered: Current medicines are reviewed at length with the patient today.  Concerns regarding medicines are outlined above.  No orders of the defined types were placed in this encounter.  No orders of the defined types were placed in this encounter.   Chief Complaint  Patient presents with   Follow-up   Coronary Artery Disease    History of Present Illness:    Joanne Mitchell is a 76 y.o. female with a hx of mild nonobstructive CAD on cardiac CTA February 2022 and myocardial coronary bridge mid section of the LAD relatively low coronary calcium score 30/47th percentile hypertension type 2 diabetes dyslipidemia and PVCs.  She was last seen 05/20/2021.  Compliance with diet, lifestyle and medications: Yes  This started off as a routine follow-up however she is concerned because she  has had vague chest discomfort at times with activity not frequent not severe and has not needed nitroglycerin she also notices her heart beating at times. She finds herself under quite a bit of stress She intends to get back to hiking and I encouraged her to get back to activity walking 30 minutes a day She is not having frequent angina edema shortness of breath or syncope She tolerates her statin without muscle pain or weakness I reviewed her labs from PCP office  Recent labs 12/27/2021 [facility] PCP: Cholesterol 149 triglycerides 100 HDL 47 non-HDL cholesterol 102 LDL direct 94 CMP showed normal liver function test creatinine 0.77 GFR 81 cc/min Past Medical History:  Diagnosis Date   Anxiety    uses Ativan 4-5x's per month   Arthritis    knee- R   Complication of anesthesia    novacaine /w epinephrine caused her heart to race, she remarks about post op, she had N&V   Diabetes mellitus without complication ([place])    TYPE 2   Dyspepsia 05/04/2016   Essential hypertension 08/17/2015   Last Assessment & Plan:  TRelevant Hx: Course: Daily Update: Today's Plan:this is stable for her at this time and she is taking her meds as directed and will continue to follow for her see her CMP today  Electronically signed by: Mayer Camel, NP 09/16/15 0947   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   Gastroesophageal reflux disease with esophagitis 05/04/2017   Generalized abdominal pain  05/04/2016   GERD (gastroesophageal reflux disease)    History of stress test    echo /w stress test, on treadmill , pt. 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 Bettina Gavia    Hypertension    Malaise and fatigue 08/17/2015   Last Assessment & Plan:  Relevant Hx: Course: Daily Update: Today's Plan:she still works about 4 day weeks but there is a lot of stress at work and that is tiring to her she is trying to rest more but still has some ease to tire will see her labs  Electronically signed by: Mayer Camel, NP 09/16/15 [phone] Overview:  Last Assessment & Plan:  Relevant Hx: Course: Daily Update: Today's Pl   Maternal blood transfusion    45 yrs. ago    Mixed hyperlipidemia 08/17/2015   Last Assessment & Plan:  Relevant Hx: Course: Daily Update: Today's Plan:update her labs for her today  Electronically signed by: Mayer Camel, NP 09/16/15 [phone] Overview:  Last Assessment & Plan:  Relevant Hx: Course: Daily Update: Today's Plan:update her labs for her today  Electronically signed by: Mayer Camel, NP 09/16/15 0949   Non-seasonal allergic rhinitis 08/17/2015   Last Assessment & Plan:  Relevant Hx: Course: Daily Update: Today's Plan:she has been having a difficult time with this and she is advised to use her flonase in the am and take zyrtec at [facility] and see if that helps control the drainage for her  Electronically signed by: Mayer Camel, NP 09/16/15 0947   Phlebitis    during pregnancy - 1 yrs. 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   Last Assessment & Plan:  Relevant Hx: Course: Daily Update: Today's Plan:she is better with her knees her hips do bother her some but she feels that is better since her surgery and increasing her exercise  Electronically signed by: Mayer Camel, NP 09/16/15 [phone] Overview:  Last Assessment & Plan:  Relevant Hx: Course: Daily Update: Today's Plan:she is better with her knees her hips d   S/P total knee arthroplasty 08/10/2014   Stress incontinence 08/17/2015   Last Assessment & Plan:  Relevant Hx: Course: Daily Update: Today's Plan:she feels this is stable for her at this time and will follow  Electronically signed by: Mayer Camel, NP 09/16/15 0950   Type 2 diabetes mellitus without complications ([place]) 12/16/5629   Last Assessment & Plan:  Relevant Hx: Course: Daily Update: Today's Plan:discussed her sugars with her she is doing  better now that she is back at the [facility] and she is working on her exercise since her knee surgery, she did so well with. She is hopeful to keep losing weight. She is having stable sugars despite having bronchitis as well  Electronically signed by: Mayer Camel, NP 05/18   Vertigo 09/19/2017   Weakness 09/05/2017    Past Surgical History:  Procedure Laterality Date   ABDOMINAL HYSTERECTOMY  2022   APPENDECTOMY     CATARACT EXTRACTION     CHOLECYSTECTOMY     COLONOSCOPY  03/31/2001   Dr Lyda Jester. Diminutive colon polyp (1). Diverticular disease left colon. Small internal hemorrhoids   HERNIA REPAIR     umbilical    JOINT REPLACEMENT Left 2009   TOTAL KNEE ARTHROPLASTY Right 08/10/2014   Procedure: TOTAL KNEE ARTHROPLASTY;  Surgeon: Vickey Huger, MD;  Location: [place];  Service: Orthopedics;  Laterality: Right;  Current Medications: Current Meds  Medication Sig   acetaminophen (TYLENOL) 325 MG tablet Take 2 tablets by mouth as needed for pain.   amLODipine (NORVASC) 5 MG tablet Take 1 tablet by mouth daily.   aspirin 81 MG tablet Take 81 mg by mouth daily.   Cholecalciferol 50 MCG (2000 UT) CAPS Take 2,000 Units by mouth daily.   fluticasone (FLONASE) 50 MCG/ACT nasal spray Place 1 spray into both nostrils daily as needed for allergies or rhinitis.   glimepiride (AMARYL) 2 MG tablet Take 2 mg by mouth daily with breakfast.   hydrochlorothiazide (MICROZIDE) 12.5 MG capsule Take 12.5 mg by mouth daily.   loratadine (CLARITIN) 10 MG tablet Take 10 mg by mouth daily.   LORazepam (ATIVAN) 0.5 MG tablet Take 0.5 mg by mouth 2 (two) times daily as needed for anxiety.   metoprolol tartrate (LOPRESSOR) 25 MG tablet Take 1 tablet (25 mg total) by mouth 2 (two) times daily as needed. (Patient taking differently: Take 25 mg by mouth 2 (two) times daily as needed (Heart skipping).)   Multiple Vitamins-Minerals (MULTIVITAMIN WITH MINERALS) tablet Take 1 tablet by mouth daily.    nitroGLYCERIN (NITROSTAT) 0.4 MG SL tablet Place 1 tablet (0.4 mg total) under the tongue every 5 (five) minutes as needed for chest pain.   Olopatadine HCl (PATADAY OP) Apply to eye as needed for allergies.   pantoprazole (PROTONIX) 40 MG tablet Take 1 tablet (40 mg total) by mouth daily.   pravastatin (PRAVACHOL) 20 MG tablet Take 1 tablet (20 mg total) by mouth daily.     Allergies:   Levofloxacin and Procaine   Social History   Socioeconomic History   Marital status: Married    Spouse name: Not on file   Number of children: 2   Years of education: Not on file   Highest education level: Not on file  Occupational History   Occupation: Retired  Tobacco Use   Smoking status: Never    Passive exposure: Past   Smokeless tobacco: Never  Vaping Use   Vaping Use: Never used  Substance and Sexual Activity   Alcohol use: Yes    Comment: "glass of wine once & a while"   Drug use: No   Sexual activity: Not on file  Other Topics Concern   Not on file  Social History Narrative   Not on file   Social Determinants of Health   Financial Resource Strain: Not on file  Food Insecurity: Not on file  Transportation Needs: Not on file  Physical Activity: Not on file  Stress: Not on file  Social Connections: Not on file     Family History: The patient's family history includes Breast cancer in her daughter; Lung cancer in her father, mother, and sister; Stomach cancer in her paternal grandmother; Stroke in her mother. There is no history of Colon cancer, Rectal cancer, Pancreatic cancer, or Esophageal cancer. ROS:   Please see the history of present illness.    All other systems reviewed and are negative.  EKGs/Labs/Other Studies Reviewed:    The following studies were reviewed today:  EKG:  EKG ordered today and personally reviewed.  The ekg ordered today demonstrates sinus rhythm and remains normal except first-degree AV block  Recent Labs: See history  Physical Exam:    VS:   BP (!) 146/70 (BP Location: Left Arm, Patient Position: Sitting, Cuff Size: Large)   Pulse 88   Ht '5\' 3"'$  (1.6 m)   Wt 218 lb (98.9 kg)  SpO2 98%   BMI 38.62 kg/m     Wt Readings from Last 3 Encounters:  04/05/22 218 lb (98.9 kg)  08/09/21 214 lb 4 oz (97.2 kg)  05/20/21 207 lb (93.9 kg)     GEN:  Well nourished, well developed in no acute distress HEENT: Normal NECK: No JVD; No carotid bruits LYMPHATICS: No lymphadenopathy CARDIAC: RRR, no murmurs, rubs, gallops RESPIRATORY:  Clear to auscultation without rales, wheezing or rhonchi  ABDOMEN: Soft, non-tender, non-distended MUSCULOSKELETAL:  No edema; No deformity  SKIN: Warm and dry NEUROLOGIC:  Alert and oriented x 3 PSYCHIATRIC:  Normal affect    Signed, Shirlee More, MD  04/05/2022 1:17 PM    [facility]

## 2022-04-21 ENCOUNTER — Telehealth: Payer: Self-pay

## 2022-04-21 MED ORDER — METOPROLOL TARTRATE 25 MG PO TABS: 25.0000 mg | ORAL_TABLET | Freq: Every day | ORAL | 3 refills | Status: DC

## 2022-04-21 NOTE — Telephone Encounter (Signed)
Spoke with pt about monitor results. Advised per Dr. Joya Gaskins note to take Lopressor once daily and if needed she could take it Bid. Pt verbalized understanding and and no further questions.

## 2022-06-15 ENCOUNTER — Telehealth: Payer: Self-pay | Admitting: Cardiology

## 2022-06-15 NOTE — Telephone Encounter (Signed)
BEST NUMBER- [id number] or [phone]   Started taking metoprolol in 03/2022 - having a lot of belching/reflux/sugar running high   Please advise

## 2022-06-15 NOTE — Telephone Encounter (Signed)
BEST NUMBER- [phone] or [phone]  Started taking metoprolol in 03/2022 - having a lot of belching/reflux/sugar running high  Please advise

## 2022-06-16 NOTE — Telephone Encounter (Signed)
Spoke with pt she stated that after starting Metoprolol she is having increased gas, belching and her blood sugars are running higher. She is asking if this could be a side effect and what should she do.

## 2022-06-23 NOTE — Telephone Encounter (Signed)
Unable to reach the patient,

## 2022-06-26 NOTE — Telephone Encounter (Signed)
Spoke with pt regarding message. Advised per Dr. Bettina Gavia to see or contact her PCP for the GI symptoms she is experiencing. Pt agreed and verbalized understanding.

## 2022-07-13 DIAGNOSIS — Z6838 Body mass index (BMI) 38.0-38.9, adult: Secondary | ICD-10-CM

## 2022-07-13 HISTORY — DX: Body mass index (BMI) 38.0-38.9, adult: Z68.38

## 2022-08-23 ENCOUNTER — Other Ambulatory Visit (INDEPENDENT_AMBULATORY_CARE_PROVIDER_SITE_OTHER): Payer: Medicare Other

## 2022-08-23 ENCOUNTER — Ambulatory Visit: Payer: Medicare Other | Admitting: Gastroenterology

## 2022-08-23 ENCOUNTER — Encounter: Payer: Self-pay | Admitting: Gastroenterology

## 2022-08-23 VITALS — BP 120/62 | HR 80 | Ht 63.0 in | Wt 211.5 lb

## 2022-08-23 DIAGNOSIS — K581 Irritable bowel syndrome with constipation: Secondary | ICD-10-CM

## 2022-08-23 DIAGNOSIS — K449 Diaphragmatic hernia without obstruction or gangrene: Secondary | ICD-10-CM

## 2022-08-23 DIAGNOSIS — R1084 Generalized abdominal pain: Secondary | ICD-10-CM | POA: Diagnosis not present

## 2022-08-23 DIAGNOSIS — K219 Gastro-esophageal reflux disease without esophagitis: Secondary | ICD-10-CM | POA: Diagnosis not present

## 2022-08-23 LAB — CBC WITH DIFFERENTIAL/PLATELET
Basophils Absolute: 0.1 10*3/uL (ref 0.0–0.1)
Basophils Relative: 1 % (ref 0.0–3.0)
Eosinophils Absolute: 0.4 10*3/uL (ref 0.0–0.7)
Eosinophils Relative: 4.3 % (ref 0.0–5.0)
HCT: 48.4 % — ABNORMAL HIGH (ref 36.0–46.0)
Hemoglobin: 16.3 g/dL — ABNORMAL HIGH (ref 12.0–15.0)
Lymphocytes Relative: 23.7 % (ref 12.0–46.0)
Lymphs Abs: 2.1 10*3/uL (ref 0.7–4.0)
MCHC: 33.6 g/dL (ref 30.0–36.0)
MCV: 92.1 fl (ref 78.0–100.0)
Monocytes Absolute: 0.7 10*3/uL (ref 0.1–1.0)
Monocytes Relative: 7.4 % (ref 3.0–12.0)
Neutro Abs: 5.6 10*3/uL (ref 1.4–7.7)
Neutrophils Relative %: 63.6 % (ref 43.0–77.0)
Platelets: 238 10*3/uL (ref 150.0–400.0)
RBC: 5.26 Mil/uL — ABNORMAL HIGH (ref 3.87–5.11)
RDW: 16 % — ABNORMAL HIGH (ref 11.5–15.5)
WBC: 8.8 10*3/uL (ref 4.0–10.5)

## 2022-08-23 LAB — COMPREHENSIVE METABOLIC PANEL
ALT: 14 U/L (ref 0–35)
AST: 17 U/L (ref 0–37)
Albumin: 4.4 g/dL (ref 3.5–5.2)
Alkaline Phosphatase: 77 U/L (ref 39–117)
BUN: 14 mg/dL (ref 6–23)
CO2: 32 mEq/L (ref 19–32)
Calcium: 10.1 mg/dL (ref 8.4–10.5)
Chloride: 99 mEq/L (ref 96–112)
Creatinine, Ser: 0.72 mg/dL (ref 0.40–1.20)
GFR: 81.22 mL/min (ref >60.00)
Glucose, Bld: 126 mg/dL — ABNORMAL HIGH (ref 70–99)
Potassium: 4.6 mEq/L (ref 3.5–5.1)
Sodium: 140 mEq/L (ref 135–145)
Total Bilirubin: 0.7 mg/dL (ref 0.2–1.2)
Total Protein: 7.4 g/dL (ref 6.0–8.3)

## 2022-08-23 MED ORDER — PANTOPRAZOLE SODIUM 40 MG PO TBEC: 40.0000 mg | DELAYED_RELEASE_TABLET | Freq: Every day | ORAL | 4 refills | Status: DC

## 2022-08-23 NOTE — Progress Notes (Signed)
Chief Complaint: GERD  Referring Provider:  Gordan Payment., MD      ASSESSMENT AND PLAN;   #1. GERD with small HH  #2. Abdo pain- H/O uterine CA- r/o recurrence  #3. IBS-C, now with occ diarrhea   Plan: -Change omeprazole to protonix 40mg  po QD #90, 4RF -CBC, CMP -CT AP with contrast -She wants to Stop ozempic (making her sick) -If still with problems, EGD. She wants to wait currently.  -FU in Aug 2024   HPI:    Joanne Mitchell is a 77 y.o. female  With H/O CAD, HTN, HLD, DM2, history of uterine CA, anxiety/depression, osteoarthritis, history of kidney stones, obesity.  Has been having indigestion and waterbrash, more so since Christmas 2023 when she had GI infection (not COVID/flu).  She denies having any odynophagia or dysphagia.  She has been on omeprazole for over 20 years.  Recently as she ran out of prescription, she started taking over-the-counter 20 mg.  She has also been given a trial of Carafate without significant benefit.  She is not keen on getting repeat EGD at this time.  Abdo pain-periumbilical area, at times moderate.  Very much concerned about cancers.  3/14- Nl CBC, TSH, CMP.  Ozempic- 4 weeks ago- Nausea, diarrhea. Doesnot feel good  She has seen Dr. Dulce Sellar.  He does not feel above symptoms are due to heart problems.  She underwent extensive GI evaluation by Dr. Jennye Boroughs.  Her last EGD was on 10/17/2018 which showed small hiatal hernia and diffuse gastritis.  Small bowel biopsies were negative for celiac disease.  H. pylori was negative.  She has longstanding history of constipation with occasional alternating diarrhea.  Constipation is better on as needed MiraLAX.  Also been diagnosed as having rectocele.  Wt Readings from Last 3 Encounters:  08/23/22 211 lb 8 oz (95.9 kg)  04/05/22 218 lb (98.9 kg)  08/09/21 214 lb 4 oz (97.2 kg)      Past GI evaluation:  Colonoscopy 03/31/2021 by Dr. Jennye Boroughs -Diminutive colonic polyp s/p  polypectomy.  Biopsies tubular adenoma -Left colonic diverticulosis -Small internal hemorrhoid -Per notes consider repeating colonoscopy in 5 years at age 51 -Previous colonoscopies 08/2015 small diminutive adenomatous polyps, colonoscopy 06/2010 diminutive adenomatous polyps, colonoscopy 03/2000 hyperplastic polyps and small hemorrhoids.  EGD 10/17/2018 -Diffuse gastritis -Small hiatal hernia -Negative small bowel biopsies for celiac, negative H. pylori biopsies.  She is s/p appendicectomy, cholecystectomy, hysterectomy, hernia repair.   Past Medical History:  Diagnosis Date   Anxiety    uses Ativan 4-5x's per month   Arthritis    knee- R   Complication of anesthesia    novacaine /w epinephrine caused her heart to race, she remarks about post op, she had N&V   Diabetes mellitus without complication    TYPE 2   Dyspepsia 05/04/2016   Essential hypertension 08/17/2015   Last Assessment & Plan:  TRelevant Hx: Course: Daily Update: Today's Plan:this is stable for her at this time and she is taking her meds as directed and will continue to follow for her see her CMP today  Electronically signed by: Krystal Clark, NP 09/16/15 [phone]   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 Dulce Sellar  Hypertension    Malaise and fatigue 08/17/2015   Last Assessment & Plan:  Relevant Hx: Course: Daily Update: Today's Plan:she still works about 4 day weeks but there is a lot of stress at work and that is tiring to her she is trying to rest more but still has some ease to tire will see her labs  Electronically signed by: Krystal Clark, NP 09/16/15 [phone] Overview:  Last Assessment & Plan:  Relevant Hx: Course: Daily Update: Today's Pl   Maternal  blood transfusion    45 yrs. ago    Mixed hyperlipidemia 08/17/2015   Last Assessment & Plan:  Relevant Hx: Course: Daily Update: Today's Plan:update her labs for her today  Electronically signed by: Krystal Clark, NP 09/16/15 [phone] Overview:  Last Assessment & Plan:  Relevant Hx: Course: Daily Update: Today's Plan:update her labs for her today  Electronically signed by: Krystal Clark, NP 09/16/15 0949   Non-seasonal allergic rhinitis 08/17/2015   Last Assessment & Plan:  Relevant Hx: Course: Daily Update: Today's Plan:she has been having a difficult time with this and she is advised to use her flonase in the am and take zyrtec at [facility] and see if that helps control the drainage for her  Electronically signed by: Krystal Clark, NP 09/16/15 0947   Phlebitis    during pregnancy - 45 yrs. 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   Last Assessment & Plan:  Relevant Hx: Course: Daily Update: Today's Plan:she is better with her knees her hips do bother her some but she feels that is better since her surgery and increasing her exercise  Electronically signed by: Krystal Clark, NP 09/16/15 [phone] Overview:  Last Assessment & Plan:  Relevant Hx: Course: Daily Update: Today's Plan:she is better with her knees her hips d   S/P total knee arthroplasty 08/10/2014   Stress incontinence 08/17/2015   Last Assessment & Plan:  Relevant Hx: Course: Daily Update: Today's Plan:she feels this is stable for her at this time and will follow  Electronically signed by: Krystal Clark, NP 09/16/15 0950   Type 2 diabetes mellitus without complications 08/17/2015   Last Assessment & Plan:  Relevant Hx: Course: Daily Update: Today's Plan:discussed her sugars with her she is doing better now that she is back at the [facility] and she is working on her exercise since her knee surgery, she did so well with. She is  hopeful to keep losing weight. She is having stable sugars despite having bronchitis as well  Electronically signed by: Krystal Clark, NP 05/18   Vertigo 09/19/2017   Weakness 09/05/2017    Past Surgical History:  Procedure Laterality Date   ABDOMINAL HYSTERECTOMY  2022   APPENDECTOMY     CATARACT EXTRACTION     CHOLECYSTECTOMY     COLONOSCOPY  03/31/2001   Dr Jennye Boroughs. Diminutive colon polyp (1). Diverticular disease left colon. Small internal hemorrhoids   HERNIA REPAIR     umbilical    JOINT REPLACEMENT Left 2009   TOTAL KNEE ARTHROPLASTY Right 08/10/2014   Procedure: TOTAL KNEE ARTHROPLASTY;  Surgeon: Dannielle Huh, MD;  Location: [facility];  Service: Orthopedics;  Laterality: Right;    Family History  Problem Relation Age of Onset   Stroke Mother    Lung cancer Mother    Lung cancer Father    Lung cancer Sister    Stomach cancer Paternal Grandmother    Breast  cancer Daughter    Colon cancer Neg Hx    Rectal cancer Neg Hx    Pancreatic cancer Neg Hx    Esophageal cancer Neg Hx     Social History   Tobacco Use   Smoking status: Never    Passive exposure: Past   Smokeless tobacco: Never  Vaping Use   Vaping Use: Never used  Substance Use Topics   Alcohol use: Yes    Comment: "glass of wine once & a while"   Drug use: No    Current Outpatient Medications  Medication Sig Dispense Refill   acetaminophen (TYLENOL) 325 MG tablet Take 2 tablets by mouth as needed for pain.     amLODipine (NORVASC) 5 MG tablet Take 1 tablet by mouth daily.     aspirin 81 MG tablet Take 81 mg by mouth daily.     Cholecalciferol 50 MCG (2000 UT) CAPS Take 2,000 Units by mouth daily.     fluticasone (FLONASE) 50 MCG/ACT nasal spray Place 1 spray into both nostrils daily as needed for allergies or rhinitis.     glimepiride (AMARYL) 2 MG tablet Take 1 mg by mouth daily with breakfast.     hydrochlorothiazide (MICROZIDE) 12.5 MG capsule Take 12.5 mg by mouth daily.     loratadine  (CLARITIN) 10 MG tablet Take 10 mg by mouth daily.     LORazepam (ATIVAN) 0.5 MG tablet Take 0.5 mg by mouth 2 (two) times daily as needed for anxiety.     metoprolol tartrate (LOPRESSOR) 25 MG tablet Take 1 tablet (25 mg total) by mouth daily. May take twice daily if needed. (Patient taking differently: Take 12.5 mg by mouth daily. May take twice daily if needed.) 180 tablet 3   Multiple Vitamins-Minerals (MULTIVITAMIN WITH MINERALS) tablet Take 1 tablet by mouth daily.     Olopatadine HCl (PATADAY OP) Apply to eye as needed for allergies.     OZEMPIC, 0.25 OR 0.5 MG/DOSE, 2 MG/3ML SOPN Inject 0.25 mg into the skin once a week.     pantoprazole (PROTONIX) 40 MG tablet Take 1 tablet (40 mg total) by mouth daily. 90 tablet 4   Polyethyl Glycol-Propyl Glycol (SYSTANE OP) Apply 1 drop to eye daily.     pravastatin (PRAVACHOL) 20 MG tablet Take 1 tablet (20 mg total) by mouth daily. 90 tablet 0   Zinc Sulfate (ORAZINC) 110 MG TABS Take 1 tablet by mouth daily.     nitroGLYCERIN (NITROSTAT) 0.4 MG SL tablet Place 1 tablet (0.4 mg total) under the tongue every 5 (five) minutes as needed for chest pain. (Patient not taking: Reported on 08/23/2022) 25 tablet 0   No current facility-administered medications for this visit.    Allergies  Allergen Reactions   Levofloxacin     Other reaction(s): Confusion (intolerance)   Procaine Other (See Comments)    With Epinephrine- causes tachycardia With Epinephrine- causes tachycardia    Review of Systems:   Psychiatric/Behavioral: Has anxiety or depression     Physical Exam:    BP 120/62 (BP Location: Left Arm, Patient Position: Sitting, Cuff Size: Normal)   Pulse 80   Ht  (1.6 m)   Wt 211 lb 8 oz (95.9 kg)   BMI 37.47 kg/m  Wt Readings from Last 3 Encounters:  08/23/22 211 lb 8 oz (95.9 kg)  04/05/22 218 lb (98.9 kg)  08/09/21 214 lb 4 oz (97.2 kg)   Constitutional:  Well-developed, in no acute distress. Psychiatric: Normal mood and  affect. Behavior is normal. HEENT: Pupils normal.  Conjunctivae are normal. No scleral icterus. Neck supple.  Cardiovascular: Normal rate, regular rhythm. No edema Pulmonary/chest: Effort normal and breath sounds normal. No wheezing, rales or rhonchi. Abdominal: Soft, nondistended. Nontender. Bowel sounds active throughout. There are no masses palpable. No hepatomegaly. Rectal: Deferred Neurological: Alert and oriented to person place and time. Skin: Skin is warm and dry. No rashes noted.    Edman Circle, MD 08/23/2022, 9:59 AM  Cc: Gordan Payment., MD

## 2022-08-23 NOTE — Patient Instructions (Signed)
_______________________________________________________  If your blood pressure at your visit was 140/90 or greater, please contact your primary care physician to follow up on this.  _______________________________________________________  If you are age 77 or older, your body mass index should be between 23-30. Your Body mass index is 37.47 kg/m. If this is out of the aforementioned range listed, please consider follow up with your Primary Care Provider.  If you are age 41 or younger, your body mass index should be between 19-25. Your Body mass index is 37.47 kg/m. If this is out of the aformentioned range listed, please consider follow up with your Primary Care Provider.   ________________________________________________________  The [facility] providers would like to encourage you to use [facility] to communicate with providers for non-urgent requests or questions.  Due to long hold times on the telephone, sending your provider a message by [facility] may be a faster and more efficient way to get a response.  Please allow 48 business hours for a response.  Please remember that this is for non-urgent requests.  _______________________________________________________  Your provider has requested that you go to the basement level for lab work before leaving today. Press "B" on the elevator. The lab is located at the first door on the left as you exit the elevator.  We have sent the following medications to your pharmacy for you to pick up at your convenience: Protonix  Stop Ozempic  You have been scheduled for an appointment with Dr. Chales Abrahams on 12-21-2022 at 1040am . Please arrive 10 minutes early for your appointment.  You have been scheduled for a CT scan of the abdomen and pelvis at [facility] ([address] [place], [place], [place] [address]).   You are scheduled on Sep 07, 2022 at 430pm. You should arrive at 215pm for registration. Please follow the written instructions below on the day of  your exam:  WARNING: IF YOU ARE ALLERGIC TO IODINE/X-RAY DYE, PLEASE NOTIFY RADIOLOGY IMMEDIATELY AT [phone]! YOU WILL BE GIVEN A 13 HOUR PREMEDICATION PREP.  1) Do not eat or drink anything after 1230pm (4 hours prior to your test) 2) You will be given 2 bottles of oral contrast to drink while on site. The solution may taste better if refrigerated, but do NOT add ice or any other liquid to this solution. Shake well before drinking.    Drink 1 bottle of contrast @ 230pm (2 hours prior to your exam)  Drink 1 bottle of contrast @ 330pm (1 hour prior to your exam)  You may take any medications as prescribed with a small amount of water, if necessary. If you take any of the following medications: METFORMIN, GLUCOPHAGE, GLUCOVANCE, AVANDAMET, RIOMET, FORTAMET, ACTOPLUS MET, JANUMET, GLUMETZA or METAGLIP, you MAY be asked to HOLD this medication 48 hours AFTER the exam.  The purpose of you drinking the oral contrast is to aid in the visualization of your intestinal tract. The contrast solution may cause some diarrhea. Depending on your individual set of symptoms, you may also receive an intravenous injection of x-ray contrast/dye. Plan on being at [facility] for 30 minutes or longer, depending on the type of exam you are having performed.  This test typically takes 30-45 minutes to complete.  If you have any questions regarding your exam or if you need to reschedule, you may call the CT department at [phone] between the hours of 8:00 am and 5:00 pm, Monday-Friday.  ________________________________________________________________________

## 2022-09-07 ENCOUNTER — Ambulatory Visit (HOSPITAL_COMMUNITY)
Admission: RE | Admit: 2022-09-07 | Discharge: 2022-09-07 | Disposition: A | Discharge status: Home / Self Care | Payer: Medicare Other | Source: Ambulatory Visit | Attending: Gastroenterology | Admitting: Gastroenterology

## 2022-09-07 DIAGNOSIS — K449 Diaphragmatic hernia without obstruction or gangrene: Secondary | ICD-10-CM | POA: Insufficient documentation

## 2022-09-07 DIAGNOSIS — K219 Gastro-esophageal reflux disease without esophagitis: Secondary | ICD-10-CM | POA: Insufficient documentation

## 2022-09-07 DIAGNOSIS — R1084 Generalized abdominal pain: Secondary | ICD-10-CM | POA: Diagnosis present

## 2022-09-07 DIAGNOSIS — K581 Irritable bowel syndrome with constipation: Secondary | ICD-10-CM | POA: Insufficient documentation

## 2022-09-07 MED ORDER — IOHEXOL 9 MG/ML PO SOLN: 500.0000 mL | ORAL | Status: AC

## 2022-09-07 MED ORDER — IOHEXOL 300 MG/ML  SOLN
100.0000 mL | Freq: Once | INTRAMUSCULAR | Status: AC | PRN
  Administered 2022-09-07: 100 mL via INTRAVENOUS

## 2022-09-29 ENCOUNTER — Telehealth: Payer: Self-pay | Admitting: Gastroenterology

## 2022-09-29 NOTE — Telephone Encounter (Signed)
Patient returning Kaira's call, states she will not be available for a call back until Monday.

## 2022-10-02 NOTE — Telephone Encounter (Signed)
Unable to reach patient x 2, line busy.

## 2022-10-03 NOTE — Telephone Encounter (Signed)
Refer to imaging result note 09/07/22.

## 2022-10-04 NOTE — Progress Notes (Unsigned)
Cardiology Office Note:    Date:  10/05/2022   ID:  Joanne Mitchell, DOB 1945/10/31, MRN [id number]  PCP:  Krystal Clark, NP  Cardiologist:  Norman Herrlich, MD    Referring MD: Gordan Payment., MD    ASSESSMENT:    1. Mild CAD   2. Coronary-myocardial bridge   3. Frequent PVCs   4. Essential hypertension   5. Mixed hyperlipidemia    PLAN:    In order of problems listed above:  She continues to do wellWith her mild CAD having no anginal discomfort continue aspirin beta-blocker and her statin I asked her if she is having more frequent cardiac symptoms using nitroglycerin to let me know we could place her on the oral monitor nitrate and otherwise I will plan to see her in 1 year. Stable on beta-blocker continue asymptomatic Well-controlled continue current treatment including her long-acting calcium channel blocker LDL is at target continue his statin with CAD   Next appointment: 1 year   Medication Adjustments/Labs and Tests Ordered: Current medicines are reviewed at length with the patient today.  Concerns regarding medicines are outlined above.  No orders of the defined types were placed in this encounter.  No orders of the defined types were placed in this encounter.   Chief Complaint  Patient presents with   Chest Pain    History of Present Illness:    Joanne Mitchell is a 77 y.o. female with a hx of mild nonobstructive CAD on cardiac CTA coronary myocardial bridge section of the LAD relatively low calcium score of 30/47th percentile hypertension type 2 diabetes dyslipidemia and PVCs last seen 04/05/2022.  Coronary  She used an event monitor reported 04/21/2022.  Ventricular ectopy was rare supraventricular ectopy was occasional there were 3 brief runs of atrial tachycardia longest 6 complexes rate of 152 bpm and the rhythm was sinus throughout without sinus pauses or second or third-degree AV nodal block.  Compliance with diet, lifestyle and  medications: Yes  She has done well no anginal discomfort no longer taking Ozempic so edema shortness of breath or syncope.   Recent labs 07/13/2022 cholesterol 139 LDL 80 triglycerides 83 HDL 42 hemoglobin 16.5 platelets 297,000 Past Medical History:  Diagnosis Date   Anxiety    uses Ativan 4-5x's per month   Arthritis    knee- R   Complication of anesthesia    novacaine /w epinephrine caused her heart to race, she remarks about post op, she had N&V   Diabetes mellitus without complication (HCC)    TYPE 2   Dyspepsia 05/04/2016   Essential hypertension 08/17/2015   Last Assessment & Plan:  TRelevant Hx: Course: Daily Update: Today's Plan:this is stable for her at this time and she is taking her meds as directed and will continue to follow for her see her CMP today  Electronically signed by: Krystal Clark, NP 09/16/15 [phone]   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 Dulce Sellar    Hypertension    Malaise and fatigue 08/17/2015   Last Assessment & Plan:  Relevant Hx: Course: Daily Update: Today's Plan:she still works about 4 day weeks but there is a lot of stress at work and that is tiring to her  she is trying to rest more but still has some ease to tire will see her labs  Electronically signed by: Krystal Clark, NP 09/16/15 [phone] Overview:  Last Assessment & Plan:  Relevant Hx: Course: Daily Update: Today's Pl   Maternal blood transfusion    45 yrs. ago    Mixed hyperlipidemia 08/17/2015   Last Assessment & Plan:  Relevant Hx: Course: Daily Update: Today's Plan:update her labs for her today  Electronically signed by: Krystal Clark, NP 09/16/15 [phone] Overview:  Last Assessment & Plan:  Relevant Hx: Course: Daily  Update: Today's Plan:update her labs for her today  Electronically signed by: Krystal Clark, NP 09/16/15 0949   Non-seasonal allergic rhinitis 08/17/2015   Last Assessment & Plan:  Relevant Hx: Course: Daily Update: Today's Plan:she has been having a difficult time with this and she is advised to use her flonase in the am and take zyrtec at [facility] and see if that helps control the drainage for her  Electronically signed by: Krystal Clark, NP 09/16/15 0947   Phlebitis    during pregnancy - 45 yrs. 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 She is hopeful to keep losing weight. She is having stable sugars despite having bronchitis as well  Electronically signed by: Krystal Clark, NP 05/18   Vertigo 09/19/2017   Weakness 09/05/2017    Past Surgical History:  Procedure Laterality Date   ABDOMINAL HYSTERECTOMY  2022   APPENDECTOMY      CATARACT EXTRACTION     CHOLECYSTECTOMY     COLONOSCOPY  03/31/2001   Dr Jennye Boroughs. Diminutive colon polyp (1). Diverticular disease left colon. Small internal hemorrhoids   HERNIA REPAIR     umbilical    JOINT REPLACEMENT Left 2009   TOTAL KNEE ARTHROPLASTY Right 08/10/2014   Procedure: TOTAL KNEE ARTHROPLASTY;  Surgeon: Dannielle Huh, MD;  Location: [facility] OR;  Service: Orthopedics;  Laterality: Right;    Current Medications: No outpatient medications have been marked as taking for the 10/05/22 encounter (Office Visit) with Baldo Daub, MD.     Allergies:   Levofloxacin and Procaine   Social History   Socioeconomic History   Marital status: Married    Spouse name: Not on file   Number of children: 2   Years of education: Not on file   Highest education level: Not on file  Occupational History   Occupation: Retired  Tobacco  Use   Smoking status: Never    Passive exposure: Past   Smokeless tobacco: Never  Vaping Use   Vaping Use: Never used  Substance and Sexual Activity   Alcohol use: Yes    Comment: "glass of wine once & a while"   Drug use: No   Sexual activity: Not on file  Other Topics Concern   Not on file  Social History Narrative   Not on file   Social Determinants of Health   Financial Resource Strain: Not on file  Food Insecurity: Not on file  Transportation Needs: Not on file  Physical Activity: Not on file  Stress: Not on file  Social Connections: Not on file     Family History: The patient's family history includes Breast cancer in her daughter; Lung cancer in her father, mother, and sister; Stomach cancer in her paternal grandmother; Stroke in her mother. There is no history of Colon cancer, Rectal cancer, Pancreatic cancer, or Esophageal cancer. ROS:   Please see the history of present illness.    All other systems reviewed and are negative.  EKGs/Labs/Other Studies Reviewed:    The following studies were reviewed today:    Physical Exam:     VS:  BP 128/68 (BP Location: Left Arm, Patient Position: Sitting, Cuff Size: Normal)   Pulse 70   Ht 5\' 3"  (1.6 m)   Wt 208 lb 12.8 oz (94.7 kg)   SpO2 95%   BMI 36.99 kg/m     Wt Readings from Last 3 Encounters:  10/05/22 208 lb 12.8 oz (94.7 kg)  08/23/22 211 lb 8 oz (95.9 kg)  04/05/22 218 lb (98.9 kg)     GEN:  Well nourished, well developed in no acute distress HEENT: Normal NECK: No JVD; No carotid bruits LYMPHATICS: No lymphadenopathy CARDIAC: RRR, no murmurs, rubs, gallops RESPIRATORY:  Clear to auscultation without rales, wheezing or rhonchi  ABDOMEN: Soft, non-tender, non-distended MUSCULOSKELETAL:  No edema; No deformity  SKIN: Warm and dry NEUROLOGIC:  Alert and oriented x 3 PSYCHIATRIC:  Normal affect    Signed, Norman Herrlich, MD  10/05/2022 2:45 PM    [facility]

## 2022-10-05 ENCOUNTER — Ambulatory Visit: Payer: Medicare Other | Attending: Cardiology | Admitting: Cardiology

## 2022-10-05 ENCOUNTER — Encounter: Payer: Self-pay | Admitting: Cardiology

## 2022-10-05 VITALS — BP 128/68 | HR 70 | Ht 63.0 in | Wt 208.8 lb

## 2022-10-05 DIAGNOSIS — I10 Essential (primary) hypertension: Secondary | ICD-10-CM

## 2022-10-05 DIAGNOSIS — E782 Mixed hyperlipidemia: Secondary | ICD-10-CM

## 2022-10-05 DIAGNOSIS — I2510 Atherosclerotic heart disease of native coronary artery without angina pectoris: Secondary | ICD-10-CM | POA: Diagnosis not present

## 2022-10-05 DIAGNOSIS — I493 Ventricular premature depolarization: Secondary | ICD-10-CM | POA: Diagnosis not present

## 2022-10-05 DIAGNOSIS — Q245 Malformation of coronary vessels: Secondary | ICD-10-CM | POA: Diagnosis not present

## 2022-10-05 NOTE — Patient Instructions (Signed)
Medication Instructions:  Your physician recommends that you continue on your current medications as directed. Please refer to the Current Medication list given to you today.  *If you need a refill on your cardiac medications before your next appointment, please call your pharmacy*   Lab Work: None Ordered If you have labs (blood work) drawn today and your tests are completely normal, you will receive your results only by: MyChart Message (if you have MyChart) OR A paper copy in the mail If you have any lab test that is abnormal or we need to change your treatment, we will call you to review the results.   Testing/Procedures: None Ordered   Follow-Up: At [facility], you and your health needs are our priority.  As part of our continuing mission to provide you with exceptional heart care, we have created designated Provider Care Teams.  These Care Teams include your primary Cardiologist (physician) and Advanced Practice Providers (APPs -  Physician Assistants and Nurse Practitioners) who all work together to provide you with the care you need, when you need it.  We recommend signing up for the patient portal called "MyChart".  Sign up information is provided on this After Visit Summary.  MyChart is used to connect with patients for Virtual Visits (Telemedicine).  Patients are able to view lab/test results, encounter notes, upcoming appointments, etc.  Non-urgent messages can be sent to your provider as well.   To learn more about what you can do with MyChart, go to [email or web address].com.    Your next appointment:   12 month(s)  The format for your next appointment:   In Person  Provider:   Brian Munley, MD    Other Instructions NA  

## 2022-12-19 ENCOUNTER — Encounter: Payer: Self-pay | Admitting: Gastroenterology

## 2022-12-19 ENCOUNTER — Ambulatory Visit: Payer: Medicare Other | Admitting: Gastroenterology

## 2022-12-19 VITALS — BP 130/60 | HR 72 | Ht 63.0 in | Wt 221.2 lb

## 2022-12-19 DIAGNOSIS — K449 Diaphragmatic hernia without obstruction or gangrene: Secondary | ICD-10-CM | POA: Diagnosis not present

## 2022-12-19 DIAGNOSIS — Z86010 Personal history of colonic polyps: Secondary | ICD-10-CM | POA: Diagnosis not present

## 2022-12-19 DIAGNOSIS — K581 Irritable bowel syndrome with constipation: Secondary | ICD-10-CM | POA: Diagnosis not present

## 2022-12-19 DIAGNOSIS — K219 Gastro-esophageal reflux disease without esophagitis: Secondary | ICD-10-CM

## 2022-12-19 DIAGNOSIS — R197 Diarrhea, unspecified: Secondary | ICD-10-CM

## 2022-12-19 DIAGNOSIS — R1084 Generalized abdominal pain: Secondary | ICD-10-CM

## 2022-12-19 MED ORDER — FAMOTIDINE 20 MG PO TABS: 20.0000 mg | ORAL_TABLET | Freq: Every day | ORAL | 3 refills | Status: DC

## 2022-12-19 MED ORDER — PANTOPRAZOLE SODIUM 40 MG PO TBEC: 40.0000 mg | DELAYED_RELEASE_TABLET | Freq: Every day | ORAL | 3 refills | Status: DC

## 2022-12-19 NOTE — Patient Instructions (Addendum)
_______________________________________________________  If your blood pressure at your visit was 140/90 or greater, please contact your primary care physician to follow up on this.  _______________________________________________________  If you are age 77 or older, your body mass index should be between 23-30. Your Body mass index is 39.19 kg/m. If this is out of the aforementioned range listed, please consider follow up with your Primary Care Provider.  If you are age 41 or younger, your body mass index should be between 19-25. Your Body mass index is 39.19 kg/m. If this is out of the aformentioned range listed, please consider follow up with your Primary Care Provider.   ________________________________________________________  The [facility] providers would like to encourage you to use [facility] to communicate with providers for non-urgent requests or questions.  Due to long hold times on the telephone, sending your provider a message by [facility] may be a faster and more efficient way to get a response.  Please allow 48 business hours for a response.  Please remember that this is for non-urgent requests.  _______________________________________________________  We have sent the following medications to your pharmacy for you to pick up at your convenience: Protonix Pepcid  Please try to lose weight gradually  Please call Dr. Urban Gibson nurse in 2 weeks at [phone] with an update on how you are doing.  Please follow up in 6 months. Give [place] a call at [phone] to schedule an appointment.  Thank you,  Dr. Lynann Bologna

## 2022-12-19 NOTE — Progress Notes (Signed)
Chief Complaint: GERD  Referring Provider:  Gordan Payment., MD      ASSESSMENT AND PLAN;   #1. GERD with small HH  #2. IBS-C  #3 H/O polyps. Recall colon 03/2026 (if pt desires, age may be prohibitive)   Plan: -Continue protonix 40mg  po QD #90, 4RF -Add pepcid 20mg  po at bedtime -Lose wt. start walking.  Avoid high-calorie foods -Call if any problems. -If still with problems, EGD followed by [facility] -FU in 6 months   HPI:    Joanne Mitchell is a 77 y.o. female  With H/O CAD, HTN, HLD, DM2, history of uterine CA, anxiety/depression, osteoarthritis, history of kidney stones, obesity.  For FU Feels better since off ozemic Still with some reflux especially at night No dysphagia or odynophagia Denies having any abdominal pain now. Negative CT Abdo/pelvis as below  Has gained weight Wt Readings from Last 3 Encounters:  12/19/22 221 lb 4 oz (100.4 kg)  10/05/22 208 lb 12.8 oz (94.7 kg)  08/23/22 211 lb 8 oz (95.9 kg)   Denies having any diarrhea or constipation.    From previous notes: Has been having indigestion and waterbrash, more so since Christmas 2023 when she had GI infection (not COVID/flu).  She denies having any odynophagia or dysphagia.  She has been on omeprazole for over 20 years.  Recently as she ran out of prescription, she started taking over-the-counter 20 mg.  She has also been given a trial of Carafate without significant benefit.  She is not keen on getting repeat EGD at this time.  Abdo pain-periumbilical area, at times moderate.  Very much concerned about cancers.  3/14- Nl CBC, TSH, CMP.  Ozempic- 4 weeks ago- Nausea, diarrhea. Doesnot feel good  She has seen Dr. Dulce Sellar.  He does not feel above symptoms are due to heart problems.  She underwent extensive GI evaluation by Dr. Jennye Boroughs.  Her last EGD was on 10/17/2018 which showed small hiatal hernia and diffuse gastritis.  Small bowel biopsies were negative for celiac disease.  H. pylori was  negative.  She has longstanding history of constipation with occasional alternating diarrhea.  Constipation is better on as needed MiraLAX.  Also been diagnosed as having rectocele.  Wt Readings from Last 3 Encounters:  12/19/22 221 lb 4 oz (100.4 kg)  10/05/22 208 lb 12.8 oz (94.7 kg)  08/23/22 211 lb 8 oz (95.9 kg)      Past GI evaluation: CT AP with contrast 09/11/2022 No acute findings within the abdomen or pelvis. 6 mm right renal calculus. No evidence of ureteral calculi or hydronephrosis. Colonic diverticulosis, without radiographic evidence of diverticulitis. Small paraumbilical ventral hernia, which contains only fat. Aortic Atherosclerosis (ICD10-I70.0).  Colonoscopy 03/31/2021 by Dr. Jennye Boroughs -Diminutive colonic polyp s/p polypectomy.  Bx: tubular adenoma -Left colonic diverticulosis -Small internal hemorrhoid -Per notes consider repeating colonoscopy in 5 years at age 43 -Previous colonoscopies 08/2015 small diminutive adenomatous polyps, colonoscopy 06/2010 diminutive adenomatous polyps, colonoscopy 03/2000 hyperplastic polyps and small hemorrhoids.  EGD 10/17/2018 -Diffuse gastritis -Small hiatal hernia -Negative small bowel biopsies for celiac, negative H. pylori biopsies.  She is s/p appendicectomy, cholecystectomy, hysterectomy, hernia repair.   Past Medical History:  Diagnosis Date   Anxiety    uses Ativan 4-5x's per month   Arthritis    knee- R   Complication of anesthesia    novacaine /w epinephrine caused her heart to race, she remarks about post op, she had N&V   Diabetes mellitus without complication (HCC)  TYPE 2   Dyspepsia 05/04/2016   Essential hypertension 08/17/2015   Last Assessment & Plan:  TRelevant Hx: Course: Daily Update: Today's Plan:this is stable for her at this time and she is taking her meds as directed and will continue to follow for her see her CMP today  Electronically signed by: Krystal Clark, NP 09/16/15 [phone]    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 Dulce Sellar    Hypertension    Malaise and fatigue 08/17/2015   Last Assessment & Plan:  Relevant Hx: Course: Daily Update: Today's Plan:she still works about 4 day weeks but there is a lot of stress at work and that is tiring to her she is trying to rest more but still has some ease to tire will see her labs  Electronically signed by: Krystal Clark, NP 09/16/15 [phone] Overview:  Last Assessment & Plan:  Relevant Hx: Course: Daily Update: Today's Pl   Maternal blood transfusion    45 yrs. ago    Mixed hyperlipidemia 08/17/2015   Last Assessment & Plan:  Relevant Hx: Course: Daily Update: Today's Plan:update her labs for her today  Electronically signed by: Krystal Clark, NP 09/16/15 [phone] Overview:  Last Assessment & Plan:  Relevant Hx: Course: Daily Update: Today's Plan:update her labs for her today  Electronically signed by: Krystal Clark, NP 09/16/15 0949   Non-seasonal allergic rhinitis 08/17/2015   Last Assessment & Plan:  Relevant Hx: Course: Daily Update: Today's Plan:she has been having a difficult time with this and she is advised to use her flonase in the am and take zyrtec at [facility] and see if that helps control the drainage for her  Electronically signed by: Krystal Clark, NP 09/16/15 0947   Phlebitis    during pregnancy - 45 yrs. 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 [employer] and she is working on her exercise since her knee surgery, she did so well with. She is hopeful to keep losing weight. She is having stable sugars despite having bronchitis as well  Electronically signed by: Krystal Clark, NP 05/18   Vertigo 09/19/2017   Weakness 09/05/2017    Past Surgical History:  Procedure Laterality Date   ABDOMINAL HYSTERECTOMY  2022   APPENDECTOMY  CATARACT EXTRACTION     CHOLECYSTECTOMY     COLONOSCOPY  03/31/2001   Dr Jennye Boroughs. Diminutive colon polyp (1). Diverticular disease left colon. Small internal hemorrhoids   HERNIA REPAIR     umbilical    JOINT REPLACEMENT Left 2009   TOTAL KNEE ARTHROPLASTY Right 08/10/2014   Procedure: TOTAL KNEE ARTHROPLASTY;  Surgeon: Dannielle Huh, MD;  Location: [facility] OR;  Service: Orthopedics;  Laterality: Right;    Family History  Problem Relation Age of Onset   Stroke Mother    Lung cancer Mother    Lung cancer Father    Lung cancer Sister    Stomach cancer Paternal Grandmother    Breast cancer Daughter    Colon cancer Neg Hx    Rectal cancer Neg Hx    Pancreatic cancer Neg Hx    Esophageal cancer Neg Hx     Social History   Tobacco Use   Smoking status: Never    Passive exposure: Past   Smokeless tobacco: Never  Vaping Use   Vaping  status: Never Used  Substance Use Topics   Alcohol use: Yes    Comment: "glass of wine once & a while"   Drug use: No    Current Outpatient Medications  Medication Sig Dispense Refill   acetaminophen (TYLENOL) 325 MG tablet Take 2 tablets by mouth as needed for pain.     amLODipine (NORVASC) 5 MG tablet Take 1 tablet by mouth daily.     aspirin 81 MG tablet Take 81 mg by mouth daily.     Cholecalciferol 50 MCG (2000 UT) CAPS Take 2,000 Units by mouth daily.     fluticasone (FLONASE) 50 MCG/ACT nasal spray Place 1 spray into both nostrils daily as needed for allergies or rhinitis.     glimepiride (AMARYL) 2 MG tablet Take 1 mg by mouth daily with breakfast.     hydrochlorothiazide (MICROZIDE) 12.5 MG capsule Take 12.5 mg by mouth daily.     loratadine (CLARITIN) 10 MG tablet Take 10 mg by mouth daily.     LORazepam (ATIVAN) 0.5 MG tablet Take 0.5 mg by mouth 2 (two) times daily as needed for anxiety.     metoprolol tartrate (LOPRESSOR) 25 MG tablet Take 1 tablet (25 mg total) by mouth daily. May take twice daily if needed. (Patient taking differently: Take 12.5 mg by mouth daily. May take twice daily if needed.) 180 tablet 3   Multiple Vitamins-Minerals (MULTIVITAMIN WITH MINERALS) tablet Take 1 tablet by mouth daily.     Olopatadine HCl (PATADAY OP) Apply to eye as needed for allergies.     pantoprazole (PROTONIX) 40 MG tablet Take 1 tablet (40 mg total) by mouth daily. 90 tablet 4   Polyethyl Glycol-Propyl Glycol (SYSTANE OP) Apply 1 drop to eye daily.     pravastatin (PRAVACHOL) 20 MG tablet Take 1 tablet (20 mg total) by mouth daily. 90 tablet 0   Zinc Sulfate (ORAZINC) 110 MG TABS Take 1 tablet by mouth daily.     nitroGLYCERIN (NITROSTAT) 0.4 MG SL tablet Place 1 tablet (0.4 mg total) under the tongue every 5 (five) minutes as needed for chest pain. (Patient not taking: Reported on 12/19/2022) 25 tablet 0   No current facility-administered medications for this visit.    Allergies   Allergen Reactions   Semaglutide     Other Reaction(s): Diarrhea, Myalgias  Abdominal pain, diarrhea   Levofloxacin     Other reaction(s): Confusion (intolerance)   Procaine Other (See Comments)  With Epinephrine- causes tachycardia With Epinephrine- causes tachycardia    Review of Systems:   Psychiatric/Behavioral: Has anxiety or depression     Physical Exam:    BP 130/60 (BP Location: Left Arm, Patient Position: Sitting, Cuff Size: Normal)   Pulse 72   Ht 5\' 3"  (1.6 m)   Wt 221 lb 4 oz (100.4 kg)   BMI 39.19 kg/m  Wt Readings from Last 3 Encounters:  12/19/22 221 lb 4 oz (100.4 kg)  10/05/22 208 lb 12.8 oz (94.7 kg)  08/23/22 211 lb 8 oz (95.9 kg)   Constitutional:  Well-developed, in no acute distress. Psychiatric: Normal mood and affect. Behavior is normal. HEENT: Pupils normal.  Conjunctivae are normal. No scleral icterus. Neck supple.  Cardiovascular: Normal rate, regular rhythm. No edema Pulmonary/chest: Effort normal and breath sounds normal. No wheezing, rales or rhonchi. Abdominal: Soft, nondistended. Nontender. Bowel sounds active throughout. There are no masses palpable. No hepatomegaly. Small umblical hernia Rectal: Deferred Neurological: Alert and oriented to person place and time. Skin: Skin is warm and dry. No rashes noted.    Edman Circle, MD 12/19/2022, 11:00 AM  Cc: Gordan Payment., MD

## 2023-03-28 ENCOUNTER — Other Ambulatory Visit: Payer: Self-pay

## 2023-03-28 MED ORDER — METOPROLOL TARTRATE 25 MG PO TABS: 25.0000 mg | ORAL_TABLET | Freq: Every day | ORAL | 1 refills | Status: DC

## 2023-06-13 HISTORY — DX: Morbid (severe) obesity due to excess calories: E66.01

## 2023-06-16 ENCOUNTER — Ambulatory Visit (HOSPITAL_BASED_OUTPATIENT_CLINIC_OR_DEPARTMENT_OTHER)
Admit: 2023-06-16 | Discharge: 2023-06-16 | Disposition: A | Discharge status: Home / Self Care | Payer: Medicare Other | Attending: Physician Assistant | Admitting: Physician Assistant

## 2023-06-16 ENCOUNTER — Encounter (HOSPITAL_BASED_OUTPATIENT_CLINIC_OR_DEPARTMENT_OTHER): Payer: Self-pay

## 2023-06-16 ENCOUNTER — Ambulatory Visit (HOSPITAL_BASED_OUTPATIENT_CLINIC_OR_DEPARTMENT_OTHER)
Admission: RE | Admit: 2023-06-16 | Discharge: 2023-06-16 | Disposition: A | Discharge status: Home / Self Care | Payer: Medicare Other | Source: Ambulatory Visit | Attending: Physician Assistant | Admitting: Physician Assistant

## 2023-06-16 VITALS — BP 166/80 | HR 96 | Temp 99.3°F | Resp 16

## 2023-06-16 DIAGNOSIS — R051 Acute cough: Secondary | ICD-10-CM | POA: Diagnosis not present

## 2023-06-16 DIAGNOSIS — R059 Cough, unspecified: Secondary | ICD-10-CM | POA: Diagnosis not present

## 2023-06-16 DIAGNOSIS — J189 Pneumonia, unspecified organism: Secondary | ICD-10-CM

## 2023-06-16 LAB — POC COVID19/FLU A&B COMBO
Covid Antigen, POC: NEGATIVE
Influenza A Antigen, POC: NEGATIVE
Influenza B Antigen, POC: NEGATIVE

## 2023-06-16 MED ORDER — DOXYCYCLINE HYCLATE 100 MG PO CAPS: 100.0000 mg | ORAL_CAPSULE | Freq: Two times a day (BID) | ORAL | 0 refills | Status: DC

## 2023-06-16 NOTE — Discharge Instructions (Signed)
The did not see pneumonia on your x-ray.  I am concerned that you might have some bronchitis versus atypical pneumonia.  Start doxycycline 100 mg twice daily for 7 days.  Stay out of the sun while on this medication.  Use over-the-counter medication including Mucinex, Flonase, Tylenol, nasal saline sinus rinses.  Follow-up with your primary care first thing next week.  Monitor your oxygen saturation if drops below 93% return here and if below 90% go to the ER.  If anything worsens and you have high fever, worsening cough, shortness of breath, chest pain, nausea/vomiting you need to be seen immediately.

## 2023-06-16 NOTE — ED Provider Notes (Signed)
[facility]    CSN: [id number] Arrival date & time: 06/16/23  1158      History   Chief Complaint Chief Complaint  Patient presents with   Fever    Possible flu - Entered by patient   Cough    HPI Joanne Mitchell is a 78 y.o. female.   Patient presents today with a 2-day history of URI symptoms.  She reports congestion, cough, chest tightness, wheezing.  She denies any fever, chest pain, nausea, vomiting, diarrhea.  Reports that she was around several children who had been exposed to influenza and have since tested positive for flu but were not sick at the time she interacted with them.  She denies any specific sick contacts.  She did have influenza vaccine.  She has had COVID several years ago but not more recently.  She had initial COVID vaccines but has not had booster.  She denies any history of asthma, allergies, smoking.  She denies any recent antibiotics or steroids.  She is eating and drinking normally.  She has tried over-the-counter cold and flu medication with minimal improvement of symptoms.    Past Medical History:  Diagnosis Date   Anxiety    uses Ativan 4-5x's per month   Arthritis    knee- R   Complication of anesthesia    novacaine /w epinephrine caused her heart to race, she remarks about post op, she had N&V   Diabetes mellitus without complication (HCC)    TYPE 2   Dyspepsia 05/04/2016   Essential hypertension 08/17/2015   Last Assessment & Plan:  TRelevant Hx: Course: Daily Update: Today's Plan:this is stable for her at this time and she is taking her meds as directed and will continue to follow for her see her CMP today  Electronically signed by: Krystal Clark, NP 09/16/15 [phone]   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 reports MD told her she  has a "floppy valve", seen by Dr. Dulce Sellar    Hypertension    Malaise and fatigue 08/17/2015   Last Assessment & Plan:  Relevant Hx: Course: Daily Update: Today's Plan:she still works about 4 day weeks but there is a lot of stress at work and that is tiring to her she is trying to rest more but still has some ease to tire will see her labs  Electronically signed by: Krystal Clark, NP 09/16/15 [phone] Overview:  Last Assessment & Plan:  Relevant Hx: Course: Daily Update: Today's Pl   Maternal blood transfusion    45 yrs. ago    Mixed hyperlipidemia 08/17/2015   Last Assessment & Plan:  Relevant Hx: Course: Daily Update: Today's Plan:update her labs for her today  Electronically signed by: Krystal Clark, NP 09/16/15 [phone] Overview:  Last Assessment & Plan:  Relevant Hx: Course: Daily Update: Today's Plan:update her labs for her today  Electronically signed by: Krystal Clark, NP 09/16/15 0949   Non-seasonal allergic rhinitis 08/17/2015   Last Assessment & Plan:  Relevant Hx: Course: Daily Update: Today's Plan:she has been having a difficult time with this and she is advised to use her flonase in the am and take zyrtec at [facility] and see if that helps control the drainage for her  Electronically signed by: Krystal Clark, NP 09/16/15 [phone]  Phlebitis    during pregnancy - 45 yrs. 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 She is hopeful to keep losing weight. 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   PONV (postoperative nausea and vomiting)    Phlebitis    Maternal blood transfusion    Hypertension    History of stress test    GERD (gastroesophageal reflux disease)    Diabetes mellitus without complication (HCC)    Complication of anesthesia    Arthritis    Postmenopausal bleeding 02/24/2020   Uterine fibroid 05/09/2018   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   Vertigo 09/19/2017   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   Weakness 09/05/2017   Gastroesophageal reflux disease with esophagitis 05/04/2017   Dyspepsia 05/04/2016   Generalized abdominal pain 05/04/2016   Anxiety 08/17/2015   Essential hypertension 08/17/2015   Malaise and fatigue 08/17/2015   Mixed hyperlipidemia 08/17/2015   Non-seasonal allergic rhinitis 08/17/2015   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 Diminutive colon polyp (1). Diverticular disease left colon. Small internal hemorrhoids   HERNIA REPAIR     umbilical    JOINT REPLACEMENT Left 2009   TOTAL KNEE ARTHROPLASTY Right 08/10/2014   Procedure: TOTAL KNEE ARTHROPLASTY;  Surgeon: Dannielle Huh, MD;  Location: [facility] OR;  Service: Orthopedics;  Laterality: Right;    OB History   No obstetric history on file.      Home Medications    Prior to Admission medications   Medication Sig Start Date End Date Taking? Authorizing Provider  acetaminophen (TYLENOL) 325 MG tablet Take 2 tablets by mouth as needed for pain. 06/11/20  Yes [provider]  amLODipine (NORVASC) 5 MG tablet Take 1 tablet by mouth daily. 08/18/20  Yes [provider]  doxycycline (VIBRAMYCIN) 100 MG capsule Take 1  capsule (100 mg total) by mouth 2 (two) times daily. 06/16/23  Yes Monnica Saltsman K, PA-C  famotidine (PEPCID) 20 MG tablet Take 1 tablet (20 mg total) by mouth at bedtime. 12/19/22  Yes Lynann Bologna, MD  glimepiride (AMARYL) 2 MG tablet Take 1 mg by mouth daily with breakfast.   Yes [provider]  hydrochlorothiazide (MICROZIDE) 12.5 MG capsule Take 12.5 mg by mouth daily. 02/21/21  Yes [provider]  LORazepam (ATIVAN) 0.5 MG tablet Take 0.5 mg by mouth 2 (two) times daily as needed for anxiety.   Yes [provider]  metoprolol tartrate (LOPRESSOR) 25 MG tablet Take 1 tablet (25 mg total) by mouth daily. May take twice daily if needed. 03/28/23  Yes Baldo Daub, MD  Multiple Vitamins-Minerals (MULTIVITAMIN WITH MINERALS) tablet Take 1 tablet by mouth daily.   Yes [provider]  pantoprazole (PROTONIX) 40 MG tablet Take 1 tablet (40 mg total) by mouth daily. 12/19/22  Yes Lynann Bologna, MD  pravastatin (PRAVACHOL) 20 MG tablet Take 1 tablet (20 mg total) by mouth daily. 12/14/17  Yes Baldo Daub, MD  Zinc  Sulfate (ORAZINC) 110 MG TABS Take 1 tablet by mouth daily. 06/24/21  Yes [provider]  aspirin 81 MG tablet Take 81 mg by mouth daily.    [provider]  fluticasone (FLONASE) 50 MCG/ACT nasal spray Place 1 spray into both nostrils daily as needed for allergies or rhinitis.    [provider]  loratadine (CLARITIN) 10 MG tablet Take 10 mg by mouth daily.    [provider]  nitroGLYCERIN (NITROSTAT) 0.4 MG SL tablet Place 1 tablet (0.4 mg total) under the tongue every 5 (five) minutes as needed for chest pain. Patient not taking: Reported on 12/19/2022 08/19/20 12/19/22  Baldo Daub, MD    Family History Family History  Problem Relation Age of Onset   Stroke Mother    Lung cancer Mother    Lung cancer Father    Lung cancer Sister    Stomach cancer Paternal Grandmother    Breast cancer Daughter    Colon cancer Neg Hx    Rectal cancer Neg Hx    Pancreatic cancer Neg Hx    Esophageal cancer Neg Hx     Social History Social History   Tobacco Use   Smoking status: Never    Passive exposure: Past   Smokeless tobacco: Never  Vaping Use   Vaping status: Never Used  Substance Use Topics   Alcohol use: Yes    Comment: "glass of wine once & a while"   Drug use: No     Allergies   Semaglutide, Levofloxacin, and Procaine   Review of Systems Review of Systems  Constitutional:  Positive for activity change. Negative for appetite change, fatigue and fever.  HENT:  Positive for congestion and sore throat. Negative for sinus pressure and sneezing.   Respiratory:  Positive for cough, chest tightness and wheezing. Negative for shortness of breath.   Cardiovascular:  Negative for chest pain.  Gastrointestinal:  Negative for abdominal pain, diarrhea, nausea and vomiting.  Musculoskeletal:  Negative for arthralgias and myalgias.  Neurological:  Positive for headaches. Negative for dizziness and light-headedness.     Physical Exam Triage Vital  Signs ED Triage Vitals  Encounter Vitals Group     BP 06/16/23 1209 (!) 166/80     Systolic BP Percentile --      Diastolic BP Percentile --  Pulse Rate 06/16/23 1209 96     Resp 06/16/23 1209 16     Temp 06/16/23 1209 99.3 F (37.4 C)     Temp Source 06/16/23 1209 Oral     SpO2 06/16/23 1209 95 %     Weight --      Height --      Head Circumference --      Peak Flow --      Pain Score 06/16/23 1215 0     Pain Loc --      Pain Education --      Exclude from Growth Chart --    No data found.  Updated Vital Signs BP (!) 166/80   Pulse 96   Temp 99.3 F (37.4 C) (Oral)   Resp 16   SpO2 95%   Visual Acuity Right Eye Distance:   Left Eye Distance:   Bilateral Distance:    Right Eye Near:   Left Eye Near:    Bilateral Near:     Physical Exam Vitals reviewed.  Constitutional:      General: She is awake. She is not in acute distress.    Appearance: Normal appearance. She is well-developed. She is not ill-appearing.     Comments: Very pleasant female appears stated age in no acute distress sitting comfortably in exam room  HENT:     Head: Normocephalic and atraumatic.     Right Ear: Tympanic membrane, ear canal and external ear normal. Tympanic membrane is not erythematous or bulging.     Left Ear: Tympanic membrane, ear canal and external ear normal. Tympanic membrane is not erythematous or bulging.     Nose:     Right Sinus: No maxillary sinus tenderness or frontal sinus tenderness.     Left Sinus: No maxillary sinus tenderness or frontal sinus tenderness.     Mouth/Throat:     Pharynx: Uvula midline. Posterior oropharyngeal erythema present. No oropharyngeal exudate.  Cardiovascular:     Rate and Rhythm: Normal rate and regular rhythm.     Heart sounds: Normal heart sounds, S1 normal and S2 normal. No murmur heard. Pulmonary:     Effort: Pulmonary effort is normal.     Breath sounds: Examination of the left-lower field reveals rales. Rales present. No  wheezing or rhonchi.  Psychiatric:        Behavior: Behavior is cooperative.      UC Treatments / Results  Labs (all labs ordered are listed, but only abnormal results are displayed) Labs Reviewed  POC COVID19/FLU A&B COMBO    EKG   Radiology DG Chest 2 View Result Date: 06/16/2023 CLINICAL DATA:  Worsening cough. EXAM: CHEST - 2 VIEW COMPARISON:  07/31/2014. FINDINGS: Bilateral lung fields are clear. Bilateral costophrenic angles are clear. Normal cardio-mediastinal silhouette. No acute osseous abnormalities. The soft tissues are within normal limits. There are surgical clips in the right upper quadrant, typical of a previous cholecystectomy. IMPRESSION: No active cardiopulmonary disease. Electronically Signed   By: Jules Schick M.D.   On: 06/16/2023 13:10    Procedures Procedures (including critical care time)  Medications Ordered in UC Medications - No data to display  Initial Impression / Assessment and Plan / UC Course  I have reviewed the triage vital signs and the nursing notes.  Pertinent labs & imaging results that were available during my care of the patient were reviewed by me and considered in my medical decision making (see chart for details).     Patient is well-appearing,  afebrile, nontoxic, tachycardic.  She tested negative for influenza and COVID in clinic and does not have a classic presentation for either.  Chest x-ray was obtained given rales no acute cardiopulmonary disease.  Given her worsening cough will cover with doxycycline for atypical pneumonia and she was instructed to avoid prolonged sun exposure while on this medication.  She is to rest and drink plenty of fluid.  She can use over-the-counter medication for symptom management.  She has a pulse oximeter at home and was encouraged to monitor oxygen saturation; we discussed that if this is below 90% she should go to the ER.  Recommended she follow-up with her primary care first thing next week.  We  discussed that if anything worsens or changes and she has high fever, worsening cough, shortness of breath, chest pain, nausea/vomiting interfering with oral intake she needs to be seen immediately.  Strict return precautions given.  Excuse note provided.  Final Clinical Impressions(s) / UC Diagnoses   Final diagnoses:  Acute cough  Atypical pneumonia     Discharge Instructions      The did not see pneumonia on your x-ray.  I am concerned that you might have some bronchitis versus atypical pneumonia.  Start doxycycline 100 mg twice daily for 7 days.  Stay out of the sun while on this medication.  Use over-the-counter medication including Mucinex, Flonase, Tylenol, nasal saline sinus rinses.  Follow-up with your primary care first thing next week.  Monitor your oxygen saturation if drops below 93% return here and if below 90% go to the ER.  If anything worsens and you have high fever, worsening cough, shortness of breath, chest pain, nausea/vomiting you need to be seen immediately.     ED Prescriptions     Medication Sig Dispense Auth. Provider   doxycycline (VIBRAMYCIN) 100 MG capsule Take 1 capsule (100 mg total) by mouth 2 (two) times daily. 14 capsule Ragan Duhon K, PA-C      PDMP not reviewed this encounter.   Jeani Hawking, PA-C 06/16/23 1317

## 2023-06-16 NOTE — ED Triage Notes (Signed)
Patient is here for cough,chills,low grade temp that started last night. Patient states she is coughing up phlegm.

## 2023-06-18 DIAGNOSIS — D472 Monoclonal gammopathy: Secondary | ICD-10-CM | POA: Insufficient documentation

## 2023-06-18 DIAGNOSIS — R778 Other specified abnormalities of plasma proteins: Secondary | ICD-10-CM | POA: Insufficient documentation

## 2023-09-11 ENCOUNTER — Encounter: Payer: Self-pay | Admitting: Gastroenterology

## 2023-09-11 ENCOUNTER — Ambulatory Visit: Admitting: Gastroenterology

## 2023-09-11 VITALS — BP 124/60 | HR 72 | Ht 62.0 in | Wt 205.0 lb

## 2023-09-11 DIAGNOSIS — Z860100 Personal history of colon polyps, unspecified: Secondary | ICD-10-CM | POA: Diagnosis not present

## 2023-09-11 DIAGNOSIS — K219 Gastro-esophageal reflux disease without esophagitis: Secondary | ICD-10-CM

## 2023-09-11 DIAGNOSIS — K581 Irritable bowel syndrome with constipation: Secondary | ICD-10-CM

## 2023-09-11 DIAGNOSIS — K449 Diaphragmatic hernia without obstruction or gangrene: Secondary | ICD-10-CM

## 2023-09-11 MED ORDER — PANTOPRAZOLE SODIUM 40 MG PO TBEC: 40.0000 mg | DELAYED_RELEASE_TABLET | Freq: Every day | ORAL | 3 refills | Status: AC

## 2023-09-11 MED ORDER — FAMOTIDINE 20 MG PO TABS: 20.0000 mg | ORAL_TABLET | Freq: Every evening | ORAL | Status: AC | PRN

## 2023-09-11 NOTE — Progress Notes (Signed)
 Chief Complaint: GERD  Referring Provider:  Edra Govern*      ASSESSMENT AND PLAN;   #1. GERD with small HH  #2. IBS-C. Better with high fiber diet and increasing water intake.  #3 H/O polyps. Recall colon 03/2026 (if pt desires, age may be prohibitive)   Plan: -Continue protonix  40mg  po QD #90, 4RF -Change pepcid  20mg  po at bedtime PRN -Continue to Lose wt gradually. Avoid high-calorie foods -Call if any problems. -If any problems in future, EGD followed by [facility] -FU PRN.   HPI:    Joanne Mitchell is a 78 y.o. female  With H/O CAD, HTN, HLD, DM2, history of uterine CA, anxiety/depression, osteoarthritis, history of kidney stones, obesity.  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  Discussed the use of AI scribe software for clinical note transcription with the patient, who gave verbal consent to proceed.  History of Present Illness Joanne Mitchell "Joanne Mitchell" is a 78 year old female with gastroesophageal reflux disease (GERD) who presents for follow-up of her reflux symptoms.  Her reflux symptoms have improved with the current regimen of Protonix  in the morning and Pepcid  at night, although she has reduced the nighttime dose. She has implemented dietary changes, such as not eating after 6 PM and consuming smaller meals, which have contributed to symptom improvement. No food getting stuck when swallowing, except for a recent incident with popcorn.  She discontinued Ozempic due to severe side effects, including nausea, vomiting, and exacerbation of her hiatus hernia symptoms. Constipation was significant while on Ozempic but has improved since discontinuation. She now manages constipation with probiotics and increased water intake, as fiber tends to cause bloating.  Feels significantly better since she has been off Ozempic.  Now has more energy.  She has been able to reduce weight from 221 pounds to 205 pounds, with dietary changes and increased  physical activity. She has returned to exercise classes and water aerobics, attending sessions at the [facility] and [facility].    No dysphagia or odynophagia Denies having any abdominal pain now. Negative CT Abdo/pelvis as below  Wt Readings from Last 3 Encounters:  09/11/23 205 lb (93 kg)  12/19/22 221 lb 4 oz (100.4 kg)  10/05/22 208 lb 12.8 oz (94.7 kg)   Denies having any diarrhea or constipation.    From previous notes: Has been having indigestion and waterbrash, more so since Christmas 2023 when she had GI infection (not COVID/flu).  She denies having any odynophagia or dysphagia.  She has been on omeprazole for over 20 years.  Recently as she ran out of prescription, she started taking over-the-counter 20 mg.  She has also been given a trial of Carafate without significant benefit.  She is not keen on getting repeat EGD at this time.  Abdo pain-periumbilical area, at times moderate.  Very much concerned about cancers.  3/14- Nl CBC, TSH, CMP.  She underwent extensive GI evaluation by Dr. Monico Anna.  Her last EGD was on 10/17/2018 which showed small hiatal hernia and diffuse gastritis.  Small bowel biopsies were negative for celiac disease.  H. pylori was negative.  She has longstanding history of constipation with occasional alternating diarrhea.  Constipation is better on as needed MiraLAX.  Also been diagnosed as having rectocele.  Wt Readings from Last 3 Encounters:  09/11/23 205 lb (93 kg)  12/19/22 221 lb 4 oz (100.4 kg)  10/05/22 208 lb 12.8 oz (94.7 kg)      Past GI  evaluation: CT AP with contrast 09/11/2022 No acute findings within the abdomen or pelvis. 6 mm right renal calculus. No evidence of ureteral calculi or hydronephrosis. Colonic diverticulosis, without radiographic evidence of diverticulitis. Small paraumbilical ventral hernia, which contains only fat. Aortic Atherosclerosis (ICD10-I70.0).  Colonoscopy 03/31/2021 by Dr.  Monico Anna -Diminutive colonic polyp s/p polypectomy.  Bx: tubular adenoma -Left colonic diverticulosis -Small internal hemorrhoid -Per notes consider repeating colonoscopy in 5 years at age 80 -Previous colonoscopies 08/2015 small diminutive adenomatous polyps, colonoscopy 06/2010 diminutive adenomatous polyps, colonoscopy 03/2000 hyperplastic polyps and small hemorrhoids.  EGD 10/17/2018 -Diffuse gastritis -Small hiatal hernia -Negative small bowel biopsies for celiac, negative H. pylori biopsies.  She is s/p appendicectomy, cholecystectomy, hysterectomy, hernia repair.   Past Medical History:  Diagnosis Date   Anxiety    uses Ativan  4-5x's per month   Arthritis    knee- R   Complication of anesthesia    novacaine /w epinephrine  caused her heart to race, she remarks about post op, she had N&V   Diabetes mellitus without complication (HCC)    TYPE 2   Dyspepsia 05/04/2016   Essential hypertension 08/17/2015   Last Assessment & Plan:  TRelevant Hx: Course: Daily Update: Today's Plan:this is stable for her at this time and she is taking her meds as directed and will continue to follow for her see her CMP today  Electronically signed by: Despina Floro, NP 09/16/15 [phone]   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 Sandee Crook    Hypertension    Malaise and fatigue 08/17/2015   Last Assessment & Plan:  Relevant Hx: Course: Daily Update: Today's Plan:she still works about 4 day weeks but there is a lot of stress at work and that is tiring to her she is trying to rest more but still has some ease to tire will see her labs  Electronically signed by: Despina Floro, NP 09/16/15 [phone] Overview:  Last Assessment & Plan:  Relevant Hx:  Course: Daily Update: Today's Pl   Maternal blood transfusion    45 yrs. ago    Mixed hyperlipidemia 08/17/2015   Last Assessment & Plan:  Relevant Hx: Course: Daily Update: Today's Plan:update her labs for her today  Electronically signed by: Despina Floro, NP 09/16/15 [phone] Overview:  Last Assessment & Plan:  Relevant Hx: Course: Daily Update: Today's Plan:update her labs for her today  Electronically signed by: Despina Floro, NP 09/16/15 0949   Non-seasonal allergic rhinitis 08/17/2015   Last Assessment & Plan:  Relevant Hx: Course: Daily Update: Today's Plan:she has been having a difficult time with this and she is advised to use her flonase  in the am and take zyrtec at [facility] and see if that helps control the drainage for her  Electronically signed by: Despina Floro, NP 09/16/15 0947   Phlebitis    during pregnancy - 45 yrs. 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 She is hopeful to keep losing weight. She is having stable sugars despite having bronchitis as well  Electronically signed by: Despina Floro, NP 05/18   Vertigo 09/19/2017   Weakness 09/05/2017    Past Surgical History:  Procedure Laterality Date   ABDOMINAL HYSTERECTOMY  2022   APPENDECTOMY     CATARACT EXTRACTION     CHOLECYSTECTOMY     COLONOSCOPY  03/31/2001   Dr Monico Anna. Diminutive colon polyp (1). Diverticular disease left colon. Small internal hemorrhoids   HERNIA REPAIR     umbilical    JOINT REPLACEMENT Left 2009   TOTAL KNEE ARTHROPLASTY Right 08/10/2014   Procedure: TOTAL KNEE ARTHROPLASTY;  Surgeon: Christie Cox, MD;  Location: [facility] OR;  Service: Orthopedics;  Laterality: Right;    Family History  Problem Relation Age of Onset   Stroke Mother    Lung cancer Mother    Lung cancer Father    Lung cancer Sister    Stomach cancer Paternal Grandmother    Breast cancer Daughter    Colon cancer Neg Hx    Rectal cancer Neg Hx    Pancreatic cancer Neg Hx    Esophageal cancer Neg Hx     Social History   Tobacco Use   Smoking status: Never    Passive exposure: Past   Smokeless tobacco: Never  Vaping Use   Vaping status: Never Used  Substance Use Topics   Alcohol use: Yes    Comment: "glass of wine once & a while"   Drug use: No    Current Outpatient Medications  Medication Sig Dispense Refill   acetaminophen  (TYLENOL ) 325 MG tablet Take 2 tablets by mouth as needed for pain.     amLODipine  (NORVASC ) 5 MG tablet Take 1 tablet by mouth daily.     aspirin 81 MG tablet Take 81 mg by mouth daily.     famotidine  (PEPCID ) 20 MG tablet Take 1 tablet (20 mg total) by mouth at bedtime. 90 tablet 3   fluticasone  (FLONASE ) 50 MCG/ACT nasal spray Place 1 spray into both nostrils daily as needed for allergies or rhinitis.     glimepiride  (AMARYL ) 2 MG tablet Taking 1 by mouth in the morning and 1/2 tab in the evenings      hydrochlorothiazide  (MICROZIDE ) 12.5 MG capsule Take 12.5 mg by mouth daily.     loratadine (CLARITIN) 10 MG tablet Take 10 mg by mouth daily.     LORazepam  (ATIVAN ) 0.5 MG tablet Take 0.5 mg by mouth 2 (two) times daily as needed for anxiety.     metoprolol  tartrate (LOPRESSOR ) 25 MG tablet Take 1 tablet (25 mg total) by mouth daily. May take twice daily if needed. 180 tablet 1   Multiple  Vitamins-Minerals (MULTIVITAMIN WITH MINERALS) tablet Take 1 tablet by mouth daily.     pantoprazole  (PROTONIX ) 40 MG tablet Take 1 tablet (40 mg total) by mouth daily. 90 tablet 3   pravastatin  (PRAVACHOL ) 20 MG tablet Take 1 tablet (20 mg total) by mouth daily. 90 tablet 0   Zinc Sulfate (ORAZINC) 110 MG TABS Take 1 tablet by mouth daily.     nitroGLYCERIN  (NITROSTAT ) 0.4 MG SL tablet Place 1 tablet (0.4 mg total) under the tongue every 5 (five) minutes as needed for chest pain. (Patient not taking: Reported on 09/11/2023) 25 tablet 0   No current facility-administered medications for this visit.    Allergies  Allergen Reactions   Semaglutide     Other Reaction(s): Diarrhea, Myalgias  Abdominal pain, diarrhea   Levofloxacin     Other reaction(s): Confusion (intolerance)   Procaine Other (See Comments)    With Epinephrine - causes tachycardia With Epinephrine - causes tachycardia    Review of Systems:   Psychiatric/Behavioral: Has anxiety or depression     Physical Exam:    BP 124/60 (BP Location: Left Arm, Patient Position: Sitting, Cuff Size: Large)   Pulse 72   Ht 5\' 2"  (1.575 m) Comment: height measure without shoes  Wt 205 lb (93 kg)   BMI 37.49 kg/m  Wt Readings from Last 3 Encounters:  09/11/23 205 lb (93 kg)  12/19/22 221 lb 4 oz (100.4 kg)  10/05/22 208 lb 12.8 oz (94.7 kg)   Constitutional:  Well-developed, in no acute distress. Psychiatric: Normal mood and affect. Behavior is normal. HEENT: Pupils normal.  Conjunctivae are normal. No scleral icterus. Neck supple.   Cardiovascular: Normal rate, regular rhythm. No edema Pulmonary/chest: Effort normal and breath sounds normal. No wheezing, rales or rhonchi. Abdominal: Soft, nondistended. Nontender. Bowel sounds active throughout. There are no masses palpable. No hepatomegaly. Small umblical hernia Rectal: Deferred Neurological: Alert and oriented to person place and time. Skin: Skin is warm and dry. No rashes noted.    Magnus Schuller, MD 09/11/2023, 11:37 AM  Cc: Brown-Patram, Melissa J*

## 2023-09-11 NOTE — Patient Instructions (Addendum)
 _______________________________________________________  If your blood pressure at your visit was 140/90 or greater, please contact your primary care physician to follow up on this.  _______________________________________________________  If you are age 78 or older, your body mass index should be between 23-30. Your Body mass index is 37.49 kg/m. If this is out of the aforementioned range listed, please consider follow up with your Primary Care Provider.  If you are age 34 or younger, your body mass index should be between 19-25. Your Body mass index is 37.49 kg/m. If this is out of the aformentioned range listed, please consider follow up with your Primary Care Provider.   ________________________________________________________  The [facility] providers would like to encourage you to use MYCHART to communicate with providers for non-urgent requests or questions.  Due to long hold times on the telephone, sending your provider a message by [facility] may be a faster and more efficient way to get a response.  Please allow 48 business hours for a response.  Please remember that this is for non-urgent requests.  _______________________________________________________  Plan: -Continue protonix  40mg  po QD #90, 4RF -Change pepcid  20mg  po at bedtime PRN -Continue to lose weight gradually. Avoid high-calorie foods  Thank you for entrusting me with your care and choosing [facility].  Dr Venice Gillis

## 2023-09-17 ENCOUNTER — Telehealth: Payer: Self-pay | Admitting: *Deleted

## 2023-09-17 ENCOUNTER — Encounter: Payer: Self-pay | Admitting: *Deleted

## 2023-09-17 MED ORDER — METOPROLOL TARTRATE 25 MG PO TABS: 25.0000 mg | ORAL_TABLET | Freq: Every day | ORAL | 1 refills | Status: AC

## 2023-09-17 NOTE — Telephone Encounter (Signed)
 Rx refill sent to pharmacy.

## 2023-10-22 ENCOUNTER — Ambulatory Visit: Admitting: Cardiology

## 2023-11-01 IMAGING — CT CT HEART MORP W/ CTA COR W/ SCORE W/ CA W/CM &/OR W/O CM
4 of 7 series · 8 of 20 positions shown, 9 images · IV contrast (APPLIED)
Comparison: None.

Addendum:
EXAM:
OVER-READ INTERPRETATION  CT CHEST

The following report is an over-read performed by radiologist Dr.
Oyvin Claesson [REDACTED] on 05/30/2021. This
over-read does not include interpretation of cardiac or coronary
anatomy or pathology. The
CLINICAL DATA: Chest pain
Cardiac/Coronary CTA
TECHNIQUE: A non-contrast, gated CT scan was obtained with axial slices of 3 mm
through the heart for calcium scoring. Calcium scoring was performed
using the Agatston method. A 120 kV prospective, gated, contrast
cardiac scan was obtained. Gantry rotation speed was 250 msecs and
collimation was 0.6 mm. Two sublingual nitroglycerin tablets (0.8
mg) were given. The 3D data set was reconstructed in 5% intervals of
the 35-75% of the R-R cycle. Diastolic phases were analyzed on a
dedicated workstation using MPR, MIP, and VRT modes. The patient
received 95 cc of contrast.

[Series 6: best diast · axial · 0.39mm/px · z∈[+1040,+1074]mm · 2 of 253 slices shown, 3 images]
[im 85/253  vessel]
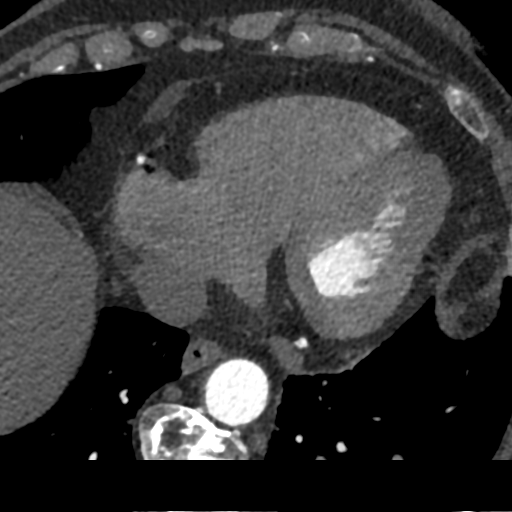
[im 85/253  lung]
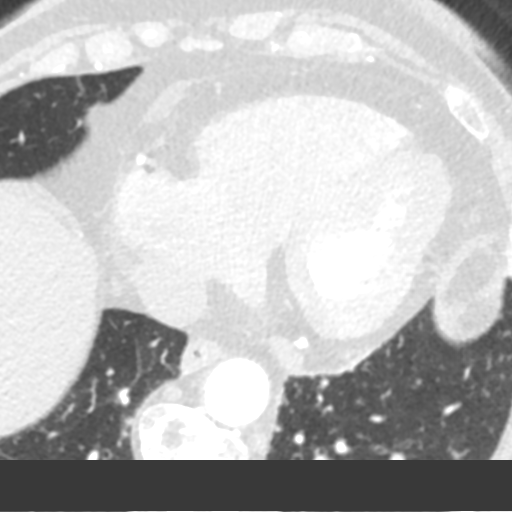
[im 169/253  vessel]
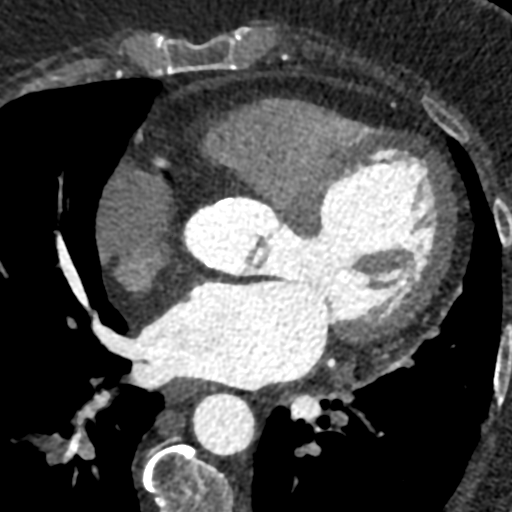

[Series 7: best syst · axial · 0.39mm/px · z∈[+1040,+1074]mm · 2 of 253 slices shown]
[im 85/253  vessel]
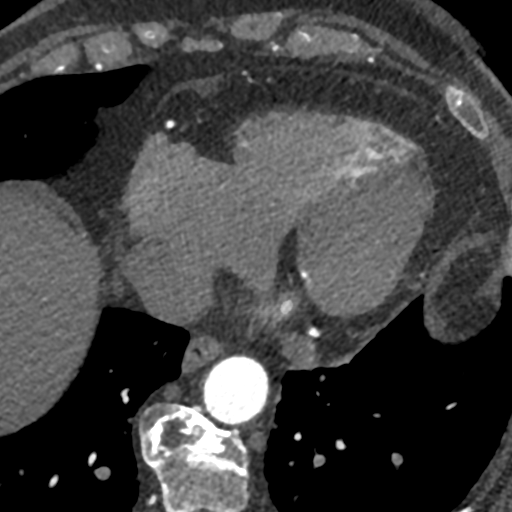
[im 169/253  vessel]
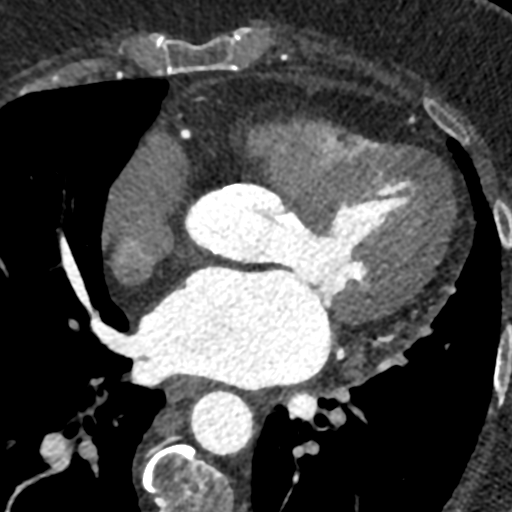

[Series 8: ts diast sharp · axial · 0.39mm/px · z∈[+1040,+1074]mm · 2 of 253 slices shown]
[im 85/253  lung]
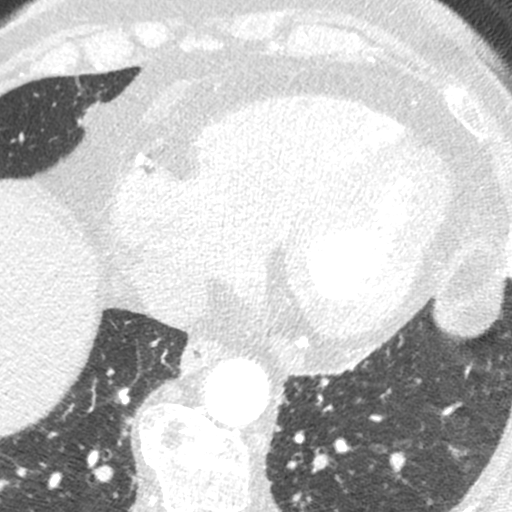
[im 169/253  lung]
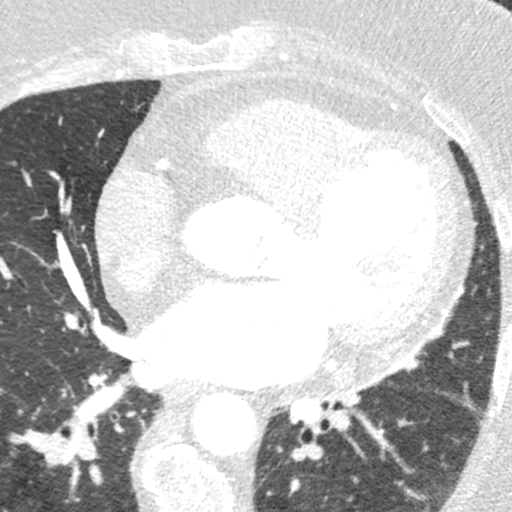

[Series 9: ts syst sharp · axial · 0.39mm/px · z∈[+1040,+1074]mm · 2 of 253 slices shown]
[im 85/253  lung]
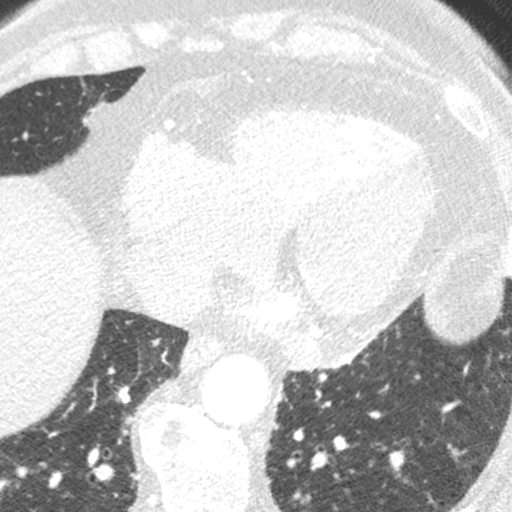
[im 169/253  lung]
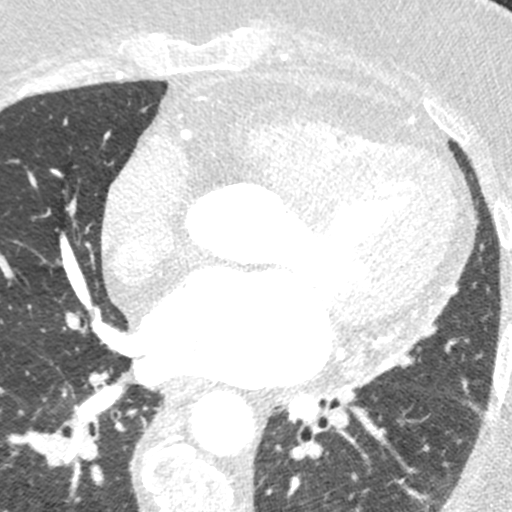

[8 of 20 positions shown; findings below may reference images not displayed]

FINDINGS: Image quality: Excellent.

Noise artifact is: Limited.

Coronary Arteries:  Normal coronary origin.  Right dominance.

Left main: The left main is a large caliber vessel with a normal
take off from the left coronary cusp that bifurcates to form a left
anterior descending artery and a left circumflex artery. There is
minimal calcified plaque in the ostial LM with associated stenosis
of <25%.

Left anterior descending artery: The LAD gives off 2 patent diagonal
branches. There is minimal calcified plaque in the proximal LAD with
associated stenosis of <25%.

Left circumflex artery: The LCX is non-dominant and patent with no
evidence of plaque or stenosis. The LCX gives off 2 patent obtuse
marginal branches. There is minimal calcified plaque in the OM1 with
associated stenosis of < 25%.

Right coronary artery: The RCA is dominant with normal take off from
the right coronary cusp. The RCA terminates as a PDA and right
posterolateral branch. There is minimal calcified plaque in the
proximal RCA with associated stenosis of < 25%.

Right Atrium: Right atrial size is within normal limits.

Right Ventricle: The right ventricular cavity is within normal
limits.

Left Atrium: Left atrial size is normal in size with no left atrial
appendage filling defect.

Left Ventricle: The ventricular cavity size is within normal limits.
There are no stigmata of prior infarction. There is no abnormal
filling defect.

Pulmonary arteries: Normal in size without proximal filling defect.

Pulmonary veins: Normal pulmonary venous drainage.

Pericardium: Normal thickness with no significant effusion or
calcium present.

Cardiac valves: The aortic valve is trileaflet without significant
calcification. The mitral valve is normal structure without
significant calcification.

Aorta: Normal caliber with mild calcifications.

Extra-cardiac findings: See attached radiology report for
non-cardiac structures.
IMPRESSION: 1. Coronary calcium score of 34. This was 45th percentile for age-,
sex, and race-matched controls.

2.  Normal coronary origin with right dominance.

3.  Minimal atherosclerosis.  CAD RADS 1.

4.  Recommend preventive therapy and risk factor modification.

5.  Consider non cardiac causes of chest pain.

RECOMMENDATIONS:
1. CAD-RADS 0: No evidence of CAD (0%). Consider non-atherosclerotic
causes of chest pain.

2. CAD-RADS 1: Minimal non-obstructive CAD (0-24%). Consider
non-atherosclerotic causes of chest pain. Consider preventive
therapy and risk factor modification.

3. CAD-RADS 2: Mild non-obstructive CAD (25-49%). Consider
non-atherosclerotic causes of chest pain. Consider preventive
therapy and risk factor modification.

4. CAD-RADS 3: Moderate stenosis. Consider symptom-guided
anti-ischemic pharmacotherapy as well as risk factor modification
per guideline directed care. Additional analysis with CT FFR will be
submitted.

5. CAD-RADS 4: Severe stenosis. (70-99% or > 50% left main). Cardiac
catheterization or CT FFR is recommended. Consider symptom-guided
anti-ischemic pharmacotherapy as well as risk factor modification
per guideline directed care. Invasive coronary angiography
recommended with revascularization per published guideline
statements.

6. CAD-RADS 5: Total coronary occlusion (100%). Consider cardiac
catheterization or viability assessment. Consider symptom-guided
anti-ischemic pharmacotherapy as well as risk factor modification
per guideline directed care.

7. CAD-RADS N: Non-diagnostic study. Obstructive CAD can't be
excluded. Alternative evaluation is recommended.

interpretation by the cardiologist is attached.

EXAM:
OVER-READ INTERPRETATION  CT CHEST

The following report is an over-read performed by radiologist Dr.
over-read does not include interpretation of cardiac or coronary
anatomy or pathology. The CTA interpretation by the cardiologist is
attached.
FINDINGS: Limited view of the lung parenchyma demonstrates no suspicious
nodularity. Airways are normal.

Limited view of the mediastinum demonstrates no adenopathy.
Esophagus normal.

Limited view of the upper abdomen unremarkable.

Limited view of the skeleton and chest wall is unremarkable.
IMPRESSION: No significant extracardiac findings.

*** End of Addendum ***
EXAM:
OVER-READ INTERPRETATION  CT CHEST

The following report is an over-read performed by radiologist Dr.
Oyvin Claesson [REDACTED] on 05/30/2021. This
over-read does not include interpretation of cardiac or coronary
anatomy or pathology. The
FINDINGS: Image quality: Excellent.

Noise artifact is: Limited.

Coronary Arteries:  Normal coronary origin.  Right dominance.

Left main: The left main is a large caliber vessel with a normal
take off from the left coronary cusp that bifurcates to form a left
anterior descending artery and a left circumflex artery. There is
minimal calcified plaque in the ostial LM with associated stenosis
of <25%.

Left anterior descending artery: The LAD gives off 2 patent diagonal
branches. There is minimal calcified plaque in the proximal LAD with
associated stenosis of <25%.

Left circumflex artery: The LCX is non-dominant and patent with no
evidence of plaque or stenosis. The LCX gives off 2 patent obtuse
marginal branches. There is minimal calcified plaque in the OM1 with
associated stenosis of < 25%.

Right coronary artery: The RCA is dominant with normal take off from
the right coronary cusp. The RCA terminates as a PDA and right
posterolateral branch. There is minimal calcified plaque in the
proximal RCA with associated stenosis of < 25%.

Right Atrium: Right atrial size is within normal limits.

Right Ventricle: The right ventricular cavity is within normal
limits.

Left Atrium: Left atrial size is normal in size with no left atrial
appendage filling defect.

Left Ventricle: The ventricular cavity size is within normal limits.
There are no stigmata of prior infarction. There is no abnormal
filling defect.

Pulmonary arteries: Normal in size without proximal filling defect.

Pulmonary veins: Normal pulmonary venous drainage.

Pericardium: Normal thickness with no significant effusion or
calcium present.

Cardiac valves: The aortic valve is trileaflet without significant
calcification. The mitral valve is normal structure without
significant calcification.

Aorta: Normal caliber with mild calcifications.

Extra-cardiac findings: See attached radiology report for
non-cardiac structures.
IMPRESSION: 1. Coronary calcium score of 34. This was 45th percentile for age-,
sex, and race-matched controls.

2.  Normal coronary origin with right dominance.

3.  Minimal atherosclerosis.  CAD RADS 1.

4.  Recommend preventive therapy and risk factor modification.

5.  Consider non cardiac causes of chest pain.

RECOMMENDATIONS:
1. CAD-RADS 0: No evidence of CAD (0%). Consider non-atherosclerotic
causes of chest pain.

2. CAD-RADS 1: Minimal non-obstructive CAD (0-24%). Consider
non-atherosclerotic causes of chest pain. Consider preventive
therapy and risk factor modification.

3. CAD-RADS 2: Mild non-obstructive CAD (25-49%). Consider
non-atherosclerotic causes of chest pain. Consider preventive
therapy and risk factor modification.

4. CAD-RADS 3: Moderate stenosis. Consider symptom-guided
anti-ischemic pharmacotherapy as well as risk factor modification
per guideline directed care. Additional analysis with CT FFR will be
submitted.

5. CAD-RADS 4: Severe stenosis. (70-99% or > 50% left main). Cardiac
catheterization or CT FFR is recommended. Consider symptom-guided
anti-ischemic pharmacotherapy as well as risk factor modification
per guideline directed care. Invasive coronary angiography
recommended with revascularization per published guideline
statements.

6. CAD-RADS 5: Total coronary occlusion (100%). Consider cardiac
catheterization or viability assessment. Consider symptom-guided
anti-ischemic pharmacotherapy as well as risk factor modification
per guideline directed care.

7. CAD-RADS N: Non-diagnostic study. Obstructive CAD can't be
excluded. Alternative evaluation is recommended.

interpretation by the cardiologist is attached.

## 2023-12-04 ENCOUNTER — Ambulatory Visit: Admitting: Cardiology

## 2024-01-14 ENCOUNTER — Ambulatory Visit (HOSPITAL_BASED_OUTPATIENT_CLINIC_OR_DEPARTMENT_OTHER): Payer: Self-pay

## 2024-02-03 NOTE — Progress Notes (Unsigned)
 Cardiology Office Note:    Date:  02/04/2024   ID:  Joanne Mitchell, DOB 1946/02/16, MRN [id number]  PCP:  Benson Eleanor Rung, NP  Cardiologist:  Redell Leiter, MD    Referring MD: Benson Eleanor PARAS*    ASSESSMENT:    1. Mild CAD   2. Coronary-myocardial bridge   3. Frequent PVCs   4. Essential hypertension   5. Mixed hyperlipidemia    PLAN:    In order of problems listed above:  Doing well with CAD coronary myocardial bridge no anginal discomfort continue aspirin current statin beta-blocker nitroglycerin  if needed Stable hypertension continue thiazide diuretic Continue her current lipid-lowering treatment No PVCs on the EKG continue beta-blocker   Next appointment: 1 year   Medication Adjustments/Labs and Tests Ordered: Current medicines are reviewed at length with the patient today.  Concerns regarding medicines are outlined above.  Orders Placed This Encounter  Procedures   EKG 12-Lead   No orders of the defined types were placed in this encounter.    History of Present Illness:    Joanne Mitchell is a 78 y.o. female with a hx of mild nonobstructive CAD on cardiac CTA with coronary myocardial bridge section of the left anterior descending coronary artery coronary calcium score of 30/47 hypertension dyslipidemia symptomatic PVCs and type 2 diabetes last seen 10/05/2022.  Recent labs 01/17/2024 hemoglobin 16.4 platelets 323,000 CMP with a sodium 135 potassium 4.1 creatinine 0.74 normal LFTs  Compliance with diet, lifestyle and medications: Yes  She has had no anginal discomfort and tolerates her statin without muscle pain or weakness.  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  Added automatically from request for surgery [id number]     Anxiety    uses Ativan  4-5x's per month   Aortic atherosclerosis 05/26/2020   Arthritis    knee- R   BMI 38.0-38.9,adult 07/13/2022   Complication of anesthesia    novacaine /w epinephrine  caused her heart to race, she remarks about post op, she had N&V   Diabetes mellitus without complication (HCC)    TYPE 2   Dyspepsia 05/04/2016   Epigastric pain 05/04/2016   Essential hypertension 08/17/2015   Last Assessment & Plan:  TRelevant Hx: Course: Daily Update: Today's Plan:this is stable for her at this time and she is taking her meds as directed and will continue to follow for her see her CMP today  Electronically signed by: Eleanor Rung Benson, NP 09/16/15 [phone]   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 reports MD told her she has a floppy valve, seen by Dr. 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 ago     Mixed hyperlipidemia 08/17/2015   Last Assessment & Plan:  Relevant Hx: Course: Daily Update: Today's Plan:update her labs for her today  Electronically signed by: Eleanor Merlynn Lady, NP 09/16/15 [phone] Overview:  Last Assessment & Plan:  Relevant Hx: Course: Daily Update: Today's Plan:update her labs for her today  Electronically signed by: Eleanor Merlynn Lady, NP 09/16/15 0949   Non-seasonal allergic rhinitis 08/17/2015   Last Assessment & Plan:  Relevant Hx: Course: Daily Update: Today's Plan:she has been having a difficult time with this and she is advised to use her flonase  in the am and take zyrtec at [facility] and see if that helps control the drainage for her  Electronically signed by: Eleanor Merlynn Lady, NP 09/16/15 0947   Phlebitis    during pregnancy - 45 yrs. ago   PONV (postoperative nausea and vomiting)    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   Postmenopausal bleeding 02/24/2020   Formatting of this note might be different from the original.  Added automatically from request for surgery [id number]     Primary osteoarthritis involving multiple joints 08/17/2015   Last Assessment & Plan:  Relevant Hx: Course: Daily Update: Today's Plan:she is better with her knees her hips do bother her some but she feels that is better since her surgery and increasing her exercise  Electronically signed by: Eleanor Merlynn Lady, NP 09/16/15 [phone] Overview:  Last Assessment & Plan:  Relevant Hx: Course: Daily Update: Today's Plan:she is better with her knees her hips d   Primary osteoarthritis of right knee 02/26/2014   Pulmonary nodule 01/19/2022   S/P total knee arthroplasty 08/10/2014   Sebaceous cyst 07/05/2021   Severe obesity (BMI 35.0-39.9) with comorbidity (HCC) 06/13/2023   Stress incontinence 08/17/2015   Last Assessment & Plan:  Relevant Hx: Course: Daily Update: Today's Plan:she feels this is stable for her at this time and will follow  Electronically signed by: Eleanor Merlynn Lady, NP 09/16/15 0950   Type 2 diabetes mellitus without complications (HCC) 08/17/2015   Last Assessment & Plan:  Relevant Hx: Course: Daily Update: Today's Plan:discussed her sugars with her she is doing better now that she is back at the [facility] and she is working on her exercise since her knee surgery, she did so well with. She is hopeful to keep losing weight. She is having stable sugars despite having bronchitis as well  Electronically signed by: Eleanor Merlynn Lady, NP 05/18   Uterine fibroid 05/09/2018   Vertigo 09/19/2017   Weakness 09/05/2017    Current Medications: Current Meds  Medication Sig   acetaminophen  (TYLENOL ) 325 MG tablet Take 2 tablets by mouth as needed for pain.   albuterol (VENTOLIN HFA) 108 (90 Base) MCG/ACT inhaler Inhale 2 puffs into the lungs every 6 (six) hours as needed.   amLODipine  (NORVASC ) 5 MG tablet Take 1 tablet by mouth daily.   aspirin 81 MG tablet Take 81 mg by mouth daily.   famotidine  (PEPCID ) 20 MG tablet Take 1  tablet (20 mg total) by mouth at bedtime as needed for heartburn or indigestion.   fluticasone  (FLONASE ) 50 MCG/ACT nasal spray Place 1 spray into both nostrils daily as needed for allergies or rhinitis.   glimepiride  (AMARYL ) 2 MG tablet Taking 1 by mouth in the morning and 1/2 tab in the evenings   hydrochlorothiazide  (MICROZIDE ) 12.5 MG capsule Take 12.5 mg by mouth daily.   loratadine (CLARITIN) 10 MG tablet Take 10 mg by mouth daily.   LORazepam  (ATIVAN ) 0.5 MG tablet Take 0.5 mg by mouth 2 (two) times daily as needed for anxiety.   metoprolol  tartrate (LOPRESSOR ) 25 MG tablet Take 1 tablet (25 mg total) by mouth daily. May take twice daily if needed.   Multiple Vitamins-Minerals (MULTIVITAMIN WITH MINERALS) tablet Take 1 tablet by mouth daily.   nitroGLYCERIN  (NITROSTAT ) 0.4 MG SL tablet Place 1 tablet (0.4 mg total) under the tongue every 5 (five) minutes as needed for chest pain.   pantoprazole  (PROTONIX ) 40 MG tablet  Take 1 tablet (40 mg total) by mouth daily.   pravastatin  (PRAVACHOL ) 20 MG tablet Take 1 tablet (20 mg total) by mouth daily.   Zinc Sulfate (ORAZINC) 110 MG TABS Take 1 tablet by mouth daily.      EKGs/Labs/Other Studies Reviewed:    The following studies were reviewed today:  Cardiac Studies & Procedures   ______________________________________________________________________________________________   STRESS TESTS  MYOCARDIAL PERFUSION IMAGING 06/18/2019  Interpretation Summary  The left ventricular ejection fraction is hyperdynamic (>65%).  Nuclear stress EF: 73%.  There was no ST segment deviation noted during stress.  This is a low risk study.  No ischemia or scar noted.  Normal EF.      MONITORS  LONG TERM MONITOR (3-14 DAYS) 04/20/2022  Narrative Patch Wear Time:  7 days and 9 hours (2023-12-06T13:53:18-0500 to 2023-12-13T23:18:32-0500)  Patient had a min HR of 46 bpm, max HR of 152 bpm, and avg HR of 72 bpm. Predominant underlying rhythm was Sinus Rhythm. First Degree AV Block was present.  There were no pauses of 3 seconds or greater and no episodes of second or third-degree AV nodal or sinus node exit block.  There were 20 triggered events at times associated with frequent APCs but predominantly without.  3 Supraventricular Tachycardia runs occurred, the run with the fastest interval lasting 6 beats with a max rate of 152 bpm (avg 129 bpm); the run with the fastest interval was also the longest.  There were no episodes of atrial fibrillation or flutter  Isolated SVEs were occasional (3.3%, 20871), SVE Couplets were rare (<1.0%, 666), and SVE Triplets were rare (<1.0%, 83).  Isolated VEs were rare (<1.0%), and no VE Couplets or VE Triplets were present.   CT SCANS  CT CORONARY MORPH W/CTA COR W/SCORE 05/30/2021  Addendum 05/30/2021  3:23 PM ADDENDUM REPORT: 05/30/2021 15:21  EXAM: OVER-READ INTERPRETATION  CT CHEST  The following report is an  over-read performed by radiologist Dr. Jackquline [facility] on 05/30/2021. This over-read does not include interpretation of cardiac or coronary anatomy or pathology. The CTA interpretation by the cardiologist is attached.  COMPARISON:  None.  FINDINGS: Limited view of the lung parenchyma demonstrates no suspicious nodularity. Airways are normal.  Limited view of the mediastinum demonstrates no adenopathy. Esophagus normal.  Limited view of the upper abdomen unremarkable.  Limited view of the skeleton and chest wall is unremarkable.  IMPRESSION: No significant extracardiac findings.   Electronically Signed By: Jackquline Boxer M.D. On:  05/30/2021 15:21  Narrative EXAM: OVER-READ INTERPRETATION  CT CHEST  The following report is an over-read performed by radiologist Dr. Wilbert Bihari of [facility] on 05/30/2021. This over-read does not include interpretation of cardiac or coronary anatomy or pathology. The  CLINICAL DATA:  Chest pain  EXAM: Cardiac/Coronary CTA  TECHNIQUE: A non-contrast, gated CT scan was obtained with axial slices of 3 mm through the heart for calcium scoring. Calcium scoring was performed using the Agatston method. A 120 kV prospective, gated, contrast cardiac scan was obtained. Gantry rotation speed was 250 msecs and collimation was 0.6 mm. Two sublingual nitroglycerin  tablets (0.8 mg) were given. The 3D data set was reconstructed in 5% intervals of the 35-75% of the R-R cycle. Diastolic phases were analyzed on a dedicated workstation using MPR, MIP, and VRT modes. The patient received 95 cc of contrast.  FINDINGS: Image quality: Excellent.  Noise artifact is: Limited.  Coronary Arteries:  Normal coronary origin.  Right dominance.  Left main: The left main is a large caliber vessel with a normal take off from the left coronary cusp that bifurcates to form a left anterior descending artery and a left  circumflex artery. There is minimal calcified plaque in the ostial LM with associated stenosis of <25%.  Left anterior descending artery: The LAD gives off 2 patent diagonal branches. There is minimal calcified plaque in the proximal LAD with associated stenosis of <25%.  Left circumflex artery: The LCX is non-dominant and patent with no evidence of plaque or stenosis. The LCX gives off 2 patent obtuse marginal branches. There is minimal calcified plaque in the OM1 with associated stenosis of < 25%.  Right coronary artery: The RCA is dominant with normal take off from the right coronary cusp. The RCA terminates as a PDA and right posterolateral branch. There is minimal calcified plaque in the proximal RCA with associated stenosis of < 25%.  Right Atrium: Right atrial size is within normal limits.  Right Ventricle: The right ventricular cavity is within normal limits.  Left Atrium: Left atrial size is normal in size with no left atrial appendage filling defect.  Left Ventricle: The ventricular cavity size is within normal limits. There are no stigmata of prior infarction. There is no abnormal filling defect.  Pulmonary arteries: Normal in size without proximal filling defect.  Pulmonary veins: Normal pulmonary venous drainage.  Pericardium: Normal thickness with no significant effusion or calcium present.  Cardiac valves: The aortic valve is trileaflet without significant calcification. The mitral valve is normal structure without significant calcification.  Aorta: Normal caliber with mild calcifications.  Extra-cardiac findings: See attached radiology report for non-cardiac structures.  IMPRESSION: 1. Coronary calcium score of 34. This was 45th percentile for age-, sex, and race-matched controls.  2.  Normal coronary origin with right dominance.  3.  Minimal atherosclerosis.  CAD RADS 1.  4.  Recommend preventive therapy and risk factor modification.  5.   Consider non cardiac causes of chest pain.  RECOMMENDATIONS: 1. CAD-RADS 0: No evidence of CAD (0%). Consider non-atherosclerotic causes of chest pain.  2. CAD-RADS 1: Minimal non-obstructive CAD (0-24%). Consider non-atherosclerotic causes of chest pain. Consider preventive therapy and risk factor modification.  3. CAD-RADS 2: Mild non-obstructive CAD (25-49%). Consider non-atherosclerotic causes of chest pain. Consider preventive therapy and risk factor modification.  4. CAD-RADS 3: Moderate stenosis. Consider symptom-guided anti-ischemic pharmacotherapy as well as risk factor modification per guideline directed care. Additional analysis with CT FFR will be submitted.  5. CAD-RADS  4: Severe stenosis. (70-99% or > 50% left main). Cardiac catheterization or CT FFR is recommended. Consider symptom-guided anti-ischemic pharmacotherapy as well as risk factor modification per guideline directed care. Invasive coronary angiography recommended with revascularization per published guideline statements.  6. CAD-RADS 5: Total coronary occlusion (100%). Consider cardiac catheterization or viability assessment. Consider symptom-guided anti-ischemic pharmacotherapy as well as risk factor modification per guideline directed care.  7. CAD-RADS N: Non-diagnostic study. Obstructive CAD can't be excluded. Alternative evaluation is recommended.  Wilbert Bihari, MD  interpretation by the cardiologist is attached.  Electronically Signed: By: Wilbert Bihari M.D. On: 05/30/2021 14:32   CT SCANS  CT CORONARY MORPH W/CTA COR W/SCORE 06/03/2020  Addendum 06/03/2020  9:07 PM ADDENDUM REPORT: 06/03/2020 21:05  HISTORY: 78 yo female with chest pain/anginal equiv, 16yr CHD risk > 20%, not treadmill candidate  EXAM: Cardiac/Coronary CTA  TECHNIQUE: The patient was scanned on a [employer].  PROTOCOL: A 120 kV prospective scan was triggered in the descending thoracic aorta at 111  HU's. Axial non-contrast 3 mm slices were carried out through the heart. The data set was analyzed on a dedicated work station and scored using the Agatson method. Gantry rotation speed was 250 msecs and collimation was .6 mm. Beta blockade and 0.8 mg of sl NTG was given. The 3D data set was reconstructed in 5% intervals of the 67-82 % of the R-R cycle. Diastolic phases were analyzed on a dedicated work station using MPR, MIP and VRT modes. The patient received 80mL OMNIPAQUE  IOHEXOL  350 MG/ML SOLN of contrast.  FINDINGS: Quality: Very good, HR 65  Coronary calcium score: The patient's coronary artery calcium score is 34, which places the patient in the 47th percentile.  Coronary arteries: Normal coronary origins.  Right dominance.  Right Coronary Artery: Dominant. Minimal mixed proximal 1-24% stenosis (CADRADS1).  Left Main Coronary Artery: Minimal calcification without stenosis. Bifurates into the LAD and LCx arteries.  Left Anterior Descending Coronary Artery: Moderate caliber vessel that tapers to a smaller, more tortuous vessel in the mid-segment a the pitchfork branchpoint of a moderate sized OM3 branch. Minimal 1-24% mixed proximal stenosis is noted (CADRADS1) in the LAD. There is a short myocardial bridging segment distal to the OM3 branch. Severe small tortuous diagonal branches noted without stenosis.  Left Circumflex Artery: AV groove vessel, no disease. Moderate sized mid-vessel OM1 branch with minimal non-calcified stenosis.  Aorta: Normal size, 35 mm at the mid ascending aorta (level of the PA bifurcation) measured double oblique. Aortic atherosclerosis. No dissection.  Aortic Valve: Trileaflet.  Annular calcifications.  Other findings:  Normal pulmonary vein drainage into the left atrium.  Normal left atrial appendage without a thrombus.  Dilated main pulmonary artery to 33 mm, suggestive of pulmonary hypertension.  IMPRESSION: 1. Minimal mixed  non-obstructive CAD, CADRADS = 1. Small intramyocardial bridging segment of the mid-LAD.  2. Coronary calcium score of 34. This was 47th percentile for age and sex matched control.  3. Normal coronary origin with right dominance.  4. Dilated main pulmonary artery to 33 mm, suggestive of pulmonary hypertension.  5. Aortic annular calcification and aortic atherosclerosis.   Electronically Signed By: Vinie JAYSON Maxcy M.D. On: 06/03/2020 21:05  Narrative EXAM: OVER-READ INTERPRETATION  CT CHEST  The following report is an over-read performed by radiologist Dr. Toribio Aye of [facility] on 06/03/2020. This over-read does not include interpretation of cardiac or coronary anatomy or pathology. The coronary calcium score/coronary CTA interpretation by the cardiologist is attached.  COMPARISON:  None.  FINDINGS: Aortic atherosclerosis. Within the visualized portions of the thorax there are no suspicious appearing pulmonary nodules or masses, there is no acute consolidative airspace disease, no pleural effusions, no pneumothorax and no lymphadenopathy. Visualized portions of the upper abdomen are unremarkable. There are no aggressive appearing lytic or blastic lesions noted in the visualized portions of the skeleton.  IMPRESSION: 1.  Aortic Atherosclerosis (ICD10-I70.0).  Electronically Signed: By: Toribio Aye M.D. On: 06/03/2020 09:48     ______________________________________________________________________________________________     EKG Interpretation Date/Time:  Monday February 04 2024 10:23:41 EDT Ventricular Rate:  72 PR Interval:  238 QRS Duration:  88 QT Interval:  386 QTC Calculation: 422 R Axis:   37  Text Interpretation: Sinus rhythm with 1st degree A-V block Nonspecific T wave abnormality When compared with ECG of 17-Dec-2007 08:31, PR interval has increased T waves are less inverted Confirmed by Monetta Rogue ([id number]) on 02/04/2024  10:31:19 AM    Physical Exam:    VS:  BP 126/60   Pulse 72   Ht 5' 3 (1.6 m)   Wt 206 lb 6.4 oz (93.6 kg)   SpO2 97%   BMI 36.56 kg/m     Wt Readings from Last 3 Encounters:  02/04/24 206 lb 6.4 oz (93.6 kg)  09/11/23 205 lb (93 kg)  12/19/22 221 lb 4 oz (100.4 kg)     GEN:  Well nourished, well developed in no acute distress HEENT: Normal NECK: No JVD; No carotid bruits LYMPHATICS: No lymphadenopathy CARDIAC: RRR, no murmurs, rubs, gallops RESPIRATORY:  Clear to auscultation without rales, wheezing or rhonchi  ABDOMEN: Soft, non-tender, non-distended MUSCULOSKELETAL:  No edema; No deformity  SKIN: Warm and dry NEUROLOGIC:  Alert and oriented x 3 PSYCHIATRIC:  Normal affect    Signed, Rogue Monetta, MD  02/04/2024 11:59 AM    [facility]

## 2024-02-04 ENCOUNTER — Ambulatory Visit: Attending: Cardiology | Admitting: Cardiology

## 2024-02-04 ENCOUNTER — Encounter: Payer: Self-pay | Admitting: Cardiology

## 2024-02-04 VITALS — BP 126/60 | HR 72 | Ht 63.0 in | Wt 206.4 lb

## 2024-02-04 DIAGNOSIS — I493 Ventricular premature depolarization: Secondary | ICD-10-CM

## 2024-02-04 DIAGNOSIS — Q245 Malformation of coronary vessels: Secondary | ICD-10-CM

## 2024-02-04 DIAGNOSIS — I2510 Atherosclerotic heart disease of native coronary artery without angina pectoris: Secondary | ICD-10-CM | POA: Diagnosis not present

## 2024-02-04 DIAGNOSIS — E782 Mixed hyperlipidemia: Secondary | ICD-10-CM

## 2024-02-04 DIAGNOSIS — I10 Essential (primary) hypertension: Secondary | ICD-10-CM

## 2024-02-04 NOTE — Patient Instructions (Signed)
 Medication Instructions:  Your physician recommends that you continue on your current medications as directed. Please refer to the Current Medication list given to you today.  *If you need a refill on your cardiac medications before your next appointment, please call your pharmacy*  Lab Work: None If you have labs (blood work) drawn today and your tests are completely normal, you will receive your results only by: MyChart Message (if you have MyChart) OR A paper copy in the mail If you have any lab test that is abnormal or we need to change your treatment, we will call you to review the results.  Testing/Procedures: None  Follow-Up: At [facility], you and your health needs are our priority.  As part of our continuing mission to provide you with exceptional heart care, our providers are all part of one team.  This team includes your primary Cardiologist (physician) and Advanced Practice Providers or APPs (Physician Assistants and Nurse Practitioners) who all work together to provide you with the care you need, when you need it.  Your next appointment:   1 year(s)  Provider:   Redell Leiter, MD    We recommend signing up for the patient portal called MyChart.  Sign up information is provided on this After Visit Summary.  MyChart is used to connect with patients for Virtual Visits (Telemedicine).  Patients are able to view lab/test results, encounter notes, upcoming appointments, etc.  Non-urgent messages can be sent to your provider as well.   To learn more about what you can do with MyChart, go to [email or web address].   Other Instructions Purchase a flutter valve.  Use Muccinex  Talk to your PCP re: nebulizer with saline for secretions

## 2024-02-07 ENCOUNTER — Telehealth: Payer: Self-pay

## 2024-02-07 NOTE — Telephone Encounter (Signed)
 Dr. Monetta,  Ms. Courter was recently seen by you in clinic on 02/04/2024.  She was doing well at that time and follow-up was planned for 1 year.  Would you be able to comment on cardiac risk for upcoming procedure?  Thank you for your help.  Please direct your response to CV DIV preop pool.  Joanne Mitchell. Alois Colgan NP-C     02/07/2024, 11:21 AM [facility] [address] [place], [place] [address] Office [phone]

## 2024-02-07 NOTE — Telephone Encounter (Signed)
   Pre-operative Risk Assessment    Patient Name: Joanne Mitchell  DOB: [birth year] MRN: [id number]   Date of last office visit: 02/04/2024 Date of next office visit: N/A   Request for Surgical Clearance    Procedure:  Right lower eyelid entropion and trichiasis repair  Date of Surgery:  Clearance 03/10/24                                Surgeon:  Dr. Renzo Zaldivar Surgeon's Group or Practice Name:  [facility]  Phone number:  [phone] Fax number:  [phone]   Type of Clearance Requested:   - Medical    Type of Anesthesia:  MAC   Additional requests/questions:    Bonney Calvert Pouch   02/07/2024, 11:11 AM

## 2024-02-07 NOTE — Telephone Encounter (Signed)
     Primary Cardiologist: None  Chart reviewed as part of pre-operative protocol coverage. Given past medical history and time since last visit, based on ACC/AHA guidelines, Ziya Destiney Sanabia would be at acceptable risk for the planned procedure without further cardiovascular testing.   Her aspirin may be held for 7 days prior to her procedure.  Please resume as soon as hemostasis is achieved.  I will route this recommendation to the requesting party via Epic fax function and remove from pre-op pool.  Please call with questions.  Josefa HERO. Ramel Tobon NP-C     02/07/2024, 11:53 AM [facility] [address] [place], [place] [address] Office [phone]

## 2024-05-26 NOTE — Progress Notes (Unsigned)
 Joanne Mitchell

## 2024-05-27 ENCOUNTER — Ambulatory Visit: Admitting: Gastroenterology

## 2024-06-11 ENCOUNTER — Ambulatory Visit: Admitting: Gastroenterology
